# Patient Record
Sex: Female | Born: 1955 | Race: White | Hispanic: No | Marital: Married | Smoking: Former smoker
Health system: Southern US, Community
[De-identification: ages and names within clinical notes are randomized; demographics above are authoritative.]

## PROBLEM LIST (undated history)

## (undated) DIAGNOSIS — I10 Essential (primary) hypertension: Secondary | ICD-10-CM

## (undated) DIAGNOSIS — K589 Irritable bowel syndrome without diarrhea: Secondary | ICD-10-CM

## (undated) DIAGNOSIS — M1990 Unspecified osteoarthritis, unspecified site: Secondary | ICD-10-CM

## (undated) DIAGNOSIS — T7840XA Allergy, unspecified, initial encounter: Secondary | ICD-10-CM

## (undated) DIAGNOSIS — J309 Allergic rhinitis, unspecified: Secondary | ICD-10-CM

## (undated) DIAGNOSIS — G43909 Migraine, unspecified, not intractable, without status migrainosus: Secondary | ICD-10-CM

## (undated) DIAGNOSIS — M8580 Other specified disorders of bone density and structure, unspecified site: Secondary | ICD-10-CM

## (undated) HISTORY — DX: Unspecified osteoarthritis, unspecified site: M19.90

## (undated) HISTORY — DX: Other specified disorders of bone density and structure, unspecified site: M85.80

## (undated) HISTORY — DX: Allergy, unspecified, initial encounter: T78.40XA

## (undated) HISTORY — DX: Allergic rhinitis, unspecified: J30.9

## (undated) HISTORY — DX: Essential (primary) hypertension: I10

## (undated) HISTORY — DX: Irritable bowel syndrome, unspecified: K58.9

## (undated) HISTORY — PX: COLONOSCOPY: SHX174

## (undated) HISTORY — PX: BREAST BIOPSY: SHX20

## (undated) HISTORY — DX: Migraine, unspecified, not intractable, without status migrainosus: G43.909

---
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
## 1983-04-25 HISTORY — PX: CHOLECYSTECTOMY: SHX55

## 1988-01-25 HISTORY — PX: TUBAL LIGATION: SHX77

## 1999-03-24 ENCOUNTER — Other Ambulatory Visit: Admission: RE | Admit: 1999-03-24 | Discharge: 1999-03-24 | Payer: Self-pay | Admitting: Obstetrics and Gynecology

## 2000-03-24 ENCOUNTER — Other Ambulatory Visit: Admission: RE | Admit: 2000-03-24 | Discharge: 2000-03-24 | Payer: Self-pay | Admitting: Obstetrics and Gynecology

## 2001-04-17 ENCOUNTER — Other Ambulatory Visit: Admission: RE | Admit: 2001-04-17 | Discharge: 2001-04-17 | Payer: Self-pay | Admitting: Obstetrics and Gynecology

## 2002-03-12 ENCOUNTER — Encounter: Payer: Self-pay | Admitting: Internal Medicine

## 2003-10-25 HISTORY — PX: DILATION AND CURETTAGE OF UTERUS: SHX78

## 2003-10-28 ENCOUNTER — Encounter (INDEPENDENT_AMBULATORY_CARE_PROVIDER_SITE_OTHER): Payer: Self-pay | Admitting: *Deleted

## 2003-10-28 ENCOUNTER — Ambulatory Visit (HOSPITAL_COMMUNITY): Admission: RE | Admit: 2003-10-28 | Discharge: 2003-10-28 | Payer: Self-pay | Admitting: Obstetrics and Gynecology

## 2004-03-11 ENCOUNTER — Ambulatory Visit: Payer: Self-pay | Admitting: Internal Medicine

## 2004-11-16 ENCOUNTER — Ambulatory Visit: Payer: Self-pay | Admitting: Internal Medicine

## 2005-08-05 ENCOUNTER — Ambulatory Visit: Payer: Self-pay | Admitting: Family Medicine

## 2006-08-16 ENCOUNTER — Ambulatory Visit: Payer: Self-pay | Admitting: Internal Medicine

## 2007-05-23 ENCOUNTER — Encounter: Payer: Self-pay | Admitting: Internal Medicine

## 2007-05-28 ENCOUNTER — Encounter (INDEPENDENT_AMBULATORY_CARE_PROVIDER_SITE_OTHER): Payer: Self-pay | Admitting: *Deleted

## 2007-05-28 LAB — HM MAMMOGRAPHY: HM Mammogram: NORMAL

## 2007-06-14 DIAGNOSIS — D239 Other benign neoplasm of skin, unspecified: Secondary | ICD-10-CM

## 2007-06-14 HISTORY — DX: Other benign neoplasm of skin, unspecified: D23.9

## 2007-10-22 ENCOUNTER — Ambulatory Visit: Payer: Self-pay | Admitting: Internal Medicine

## 2007-10-22 DIAGNOSIS — J301 Allergic rhinitis due to pollen: Secondary | ICD-10-CM

## 2007-10-22 DIAGNOSIS — G43009 Migraine without aura, not intractable, without status migrainosus: Secondary | ICD-10-CM | POA: Insufficient documentation

## 2007-10-22 DIAGNOSIS — IMO0002 Reserved for concepts with insufficient information to code with codable children: Secondary | ICD-10-CM | POA: Insufficient documentation

## 2007-11-06 ENCOUNTER — Ambulatory Visit: Payer: Self-pay | Admitting: Family Medicine

## 2008-01-22 ENCOUNTER — Ambulatory Visit: Payer: Self-pay | Admitting: Gastroenterology

## 2008-02-15 ENCOUNTER — Ambulatory Visit: Payer: Self-pay | Admitting: Gastroenterology

## 2008-02-15 ENCOUNTER — Encounter: Payer: Self-pay | Admitting: Gastroenterology

## 2008-02-15 LAB — HM COLONOSCOPY

## 2008-02-19 ENCOUNTER — Encounter: Payer: Self-pay | Admitting: Gastroenterology

## 2008-05-05 ENCOUNTER — Ambulatory Visit: Payer: Self-pay | Admitting: Family Medicine

## 2008-05-05 DIAGNOSIS — G5600 Carpal tunnel syndrome, unspecified upper limb: Secondary | ICD-10-CM | POA: Insufficient documentation

## 2008-05-07 ENCOUNTER — Encounter: Payer: Self-pay | Admitting: Internal Medicine

## 2008-05-20 ENCOUNTER — Encounter: Payer: Self-pay | Admitting: Family Medicine

## 2008-05-23 ENCOUNTER — Encounter: Payer: Self-pay | Admitting: Internal Medicine

## 2008-05-27 ENCOUNTER — Encounter: Payer: Self-pay | Admitting: Internal Medicine

## 2008-05-28 DIAGNOSIS — R928 Other abnormal and inconclusive findings on diagnostic imaging of breast: Secondary | ICD-10-CM | POA: Insufficient documentation

## 2008-05-29 ENCOUNTER — Ambulatory Visit (HOSPITAL_BASED_OUTPATIENT_CLINIC_OR_DEPARTMENT_OTHER): Admission: RE | Admit: 2008-05-29 | Discharge: 2008-05-29 | Payer: Self-pay | Admitting: Orthopedic Surgery

## 2008-06-05 ENCOUNTER — Encounter: Payer: Self-pay | Admitting: Family Medicine

## 2008-06-12 ENCOUNTER — Encounter: Payer: Self-pay | Admitting: Family Medicine

## 2008-07-03 ENCOUNTER — Encounter: Payer: Self-pay | Admitting: Family Medicine

## 2008-08-15 ENCOUNTER — Encounter: Payer: Self-pay | Admitting: Family Medicine

## 2008-09-22 ENCOUNTER — Telehealth: Payer: Self-pay | Admitting: Internal Medicine

## 2008-09-24 ENCOUNTER — Telehealth: Payer: Self-pay | Admitting: Internal Medicine

## 2008-10-20 ENCOUNTER — Ambulatory Visit: Payer: Self-pay | Admitting: Family Medicine

## 2008-11-27 ENCOUNTER — Encounter: Payer: Self-pay | Admitting: Internal Medicine

## 2008-12-04 ENCOUNTER — Encounter (INDEPENDENT_AMBULATORY_CARE_PROVIDER_SITE_OTHER): Payer: Self-pay | Admitting: *Deleted

## 2009-04-14 ENCOUNTER — Ambulatory Visit: Payer: Self-pay | Admitting: Nurse Practitioner

## 2009-05-01 ENCOUNTER — Ambulatory Visit (HOSPITAL_COMMUNITY): Admission: RE | Admit: 2009-05-01 | Discharge: 2009-05-01 | Payer: Self-pay | Admitting: Family Medicine

## 2009-05-19 ENCOUNTER — Ambulatory Visit: Payer: Self-pay | Admitting: Nurse Practitioner

## 2009-05-28 ENCOUNTER — Encounter: Payer: Self-pay | Admitting: Internal Medicine

## 2009-06-01 ENCOUNTER — Encounter: Payer: Self-pay | Admitting: Internal Medicine

## 2009-10-05 ENCOUNTER — Ambulatory Visit: Payer: Self-pay | Admitting: Internal Medicine

## 2009-10-21 ENCOUNTER — Encounter (INDEPENDENT_AMBULATORY_CARE_PROVIDER_SITE_OTHER): Payer: Self-pay | Admitting: *Deleted

## 2009-12-31 ENCOUNTER — Ambulatory Visit: Payer: Self-pay | Admitting: Internal Medicine

## 2009-12-31 DIAGNOSIS — J019 Acute sinusitis, unspecified: Secondary | ICD-10-CM

## 2010-01-27 ENCOUNTER — Ambulatory Visit
Admission: RE | Admit: 2010-01-27 | Discharge: 2010-01-27 | Payer: Self-pay | Source: Home / Self Care | Attending: Family Medicine | Admitting: Family Medicine

## 2010-02-09 ENCOUNTER — Ambulatory Visit
Admission: RE | Admit: 2010-02-09 | Discharge: 2010-02-09 | Payer: Self-pay | Source: Home / Self Care | Attending: Internal Medicine | Admitting: Internal Medicine

## 2010-02-23 NOTE — Miscellaneous (Signed)
Summary: flu vaccine at [facility]   Clinical Lists Changes  Observations: Added new observation of FLU VAX: Historical (10/20/2009 9:13)      Immunization History:  Influenza Immunization History:    Influenza:  historical (10/20/2009) Pt received flu vaccine at [facility] [address] , [place].       Lowella Petties CMA  October 21, 2009 9:13 AM

## 2010-02-23 NOTE — Letter (Signed)
Summary: Results Follow up Letter  [place] at [facility]  [address] [place], [place] [address]   Phone: [phone]  Fax: [phone]    06/01/2009 MRN: [id number]  [facility] [address] [place], [place]  [address]  Dear Ms. Deiss,  The following are the results of your recent test(s):  Test         Result    Pap Smear:        Normal _____  Not Normal _____ Comments: ______________________________________________________ Cholesterol: LDL(Bad cholesterol):         Your goal is less than:         HDL (Good cholesterol):       Your goal is more than: Comments:  ______________________________________________________ Mammogram:        Normal __X___  Not Normal _____ Comments:Mammo and ultrasound of breasts was fine. Repeat recommended in 1 year  ___________________________________________________________________ Hemoccult:        Normal _____  Not normal _______ Comments:    _____________________________________________________________________ Other Tests:    We routinely do not discuss normal results over the telephone.  If you desire a copy of the results, or you have any questions about this information we can discuss them at your next office visit.   Sincerely,      Tillman Abide, MD

## 2010-02-23 NOTE — Assessment & Plan Note (Signed)
Summary: 2:00 ?SINUS INFECTION/CLE   Vital Signs:  Patient profile:   55 year old female Weight:      148 pounds BMI:     25.50 Temp:     98.4 degrees F oral Resp:     14 per minute BP sitting:   110 / 78  (left arm) Cuff size:   regular  Vitals Entered By: Mervin Hack CMA Duncan Dull) (October 05, 2009 2:07 PM) CC: sinus infection   History of Present Illness: Started wtih sinus congestion and head symptoms Started about 3 weeks ago and now just went into her chest about 2 weeks ago cough with colored sputum now  No fever No SOB Initially had sore throat--resolved after a couple of days No ear pain  Tried claritin, tylenol and then dayquil and nyquil (helped her sleep)  Allergies: No Known Drug Allergies  Past History:  Past medical, surgical, family and social histories (including risk factors) reviewed for relevance to current acute and chronic problems.  Past Medical History: Reviewed history from 10/22/2007 and no changes required. Migraines IBS Osteoarthritis Allergic rhinitis  Past Surgical History: Reviewed history from 10/22/2007 and no changes required. 12/76 Tonsillectomy 4/85 Cholecystectomy VD x 2 1990 Tubal ligation 10/05 Conization, D&C  Family History: Reviewed history from 10/22/2007 and no changes required. Dad with HTN, DM Mom has macular degeneration Macular degeneration strong in family DM also in pat GF 1 brother, 1 sister with HTN and cerebral aneurysm Mat aunt with breast cancer  Social History: Reviewed history from 11/06/2007 and no changes required. 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  NAD Head:  No maxillary or frontal tenderness Ears:  R ear normal and L ear normal.   Nose:  moderate  congestion and inflammation Mouth:  no erythema and no exudates.   Neck:  supple, no masses, and no cervical lymphadenopathy.   Lungs:  normal respiratory effort, no intercostal retractions, no accessory muscle use, and normal breath sounds.     Impression & Recommendations:  Problem # 1:  SINUSITIS, ACUTE (ICD-461.9) Assessment New has been going on for 3 weeks will recommend analgesics amoxil for 10 days  Complete Medication List: 1)  Flonase 50 Mcg/act Susp (Fluticasone propionate) .... 2 sprays each nostril daily for allergies as needed 2)  Imitrex 50 Mg Tabs (Sumatriptan succinate) .Marland Kitchen.. 1 tab at onset, may repeat in 2 hrs prn 3)  Effexor Xr 75 Mg Xr24h-cap (Venlafaxine hcl) .... Take 1 by mouth once daily 4)  Amoxicillin 500 Mg Tabs (Amoxicillin) .... 2 tabs by mouth two times a day for respiratory infection  Patient Instructions: 1)  Please schedule a follow-up appointment in 1 year.  Prescriptions: AMOXICILLIN 500 MG TABS (AMOXICILLIN) 2 tabs by mouth two times a day for respiratory infection  #40 x 0   Entered and Authorized by:   Cindee Salt MD   Signed by:   Cindee Salt MD on 10/05/2009   Method used:   Electronically to        [facility]. [phone]* (retail)       [address]       [place], [place]  [address]  Ph: [phone]       Fax: [phone]   RxID:   [id number]   Current Allergies (reviewed today): No known allergies

## 2010-02-23 NOTE — Assessment & Plan Note (Signed)
Summary: ?SINUS INFECTION,? LOW IRON/CLE   Vital Signs:  Patient profile:   55 year old female Weight:      148 pounds Temp:     98.9 degrees F oral Resp:     14 per minute BP sitting:   100 / 78  (left arm) Cuff size:   regular  Vitals Entered By: Mervin Hack CMA Duncan Dull) (December 31, 2009 5:18 PM) CC: sinus infection?   History of Present Illness: "worn out" Having bad head congestion and constant cough Cough is esp at night----green mucus esp in AM Going on intermittently for 3-4 weeks  No fever but feels cold No sore throat or ear pain Eyes bother her--"feel weird"    Slightly blood shot  No SOB  Tried claritin, sudafed, dayquil--no apparent help cough keeping her up  Allergies: No Known Drug Allergies  Past History:  Past medical, surgical, family and social histories (including risk factors) reviewed for relevance to current acute and chronic problems.  Past Medical History: Reviewed history from 10/22/2007 and no changes required. Migraines IBS Osteoarthritis Allergic rhinitis  Past Surgical History: Reviewed history from 10/22/2007 and no changes required. 12/76 Tonsillectomy 4/85 Cholecystectomy VD x 2 1990 Tubal ligation 10/05 Conization, D&C  Family History: Reviewed history from 10/22/2007 and no changes required. Dad with HTN, DM Mom has macular degeneration Macular degeneration strong in family DM also in pat GF 1 brother, 1 sister with HTN and cerebral aneurysm Mat aunt with breast cancer  Social History: Reviewed history from 10/05/2009 and no changes required. 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  NAD Head:  no maxillary or frontal tenderness Ears:  R ear normal and L ear  normal.   Nose:  moderate congestion with opaque mucus on right Mouth:  no erythema and no exudates.   Neck:  supple, no masses, no thyromegaly, and no cervical lymphadenopathy.   Lungs:  normal respiratory effort, no intercostal retractions, no accessory muscle use, normal breath sounds, no crackles, and no wheezes.     Impression & Recommendations:  Problem # 1:  SINUSITIS - ACUTE-NOS (ICD-461.9) Assessment New going on over 3 weeks appropriate to try antibiotics analgesics tramadol for cough  Complete Medication List: 1)  Flonase 50 Mcg/act Susp (Fluticasone propionate) .... 2 sprays each nostril daily for allergies as needed 2)  Imitrex 50 Mg Tabs (Sumatriptan succinate) .Marland Kitchen.. 1 tab at onset, may repeat in 2 hrs prn 3)  Effexor Xr 75 Mg Xr24h-cap (Venlafaxine hcl) .... Take 1 by mouth once daily 4)  Amoxicillin 500 Mg Tabs (Amoxicillin) .... 2 tabs by mouth two times a day for respiratory infection 5)  Tramadol Hcl 50 Mg Tabs (Tramadol hcl) .Marland Kitchen.. 1-2 at bedtime as needed for severe cough  Patient Instructions: 1)  Please set up appt for physical at your convenience Prescriptions: TRAMADOL HCL 50 MG TABS (TRAMADOL HCL) 1-2 at bedtime as needed for severe cough  #30 x 0   Entered and Authorized by:   Cindee Salt MD   Signed by:   Cindee Salt MD on 12/31/2009   Method used:   Electronically to        [facility]. [phone]* (retail)  [address]       [place], [place]  [address]       Ph: [phone]       Fax: [phone]   RxID:   [id number] AMOXICILLIN 500 MG TABS (AMOXICILLIN) 2 tabs by mouth two times a day for respiratory infection  #40 x 0   Entered and Authorized by:   Cindee Salt MD   Signed by:   Cindee Salt MD on 12/31/2009   Method used:   Electronically to        [facility]. [phone]* (retail)       [address]       [place], [place]  [address]       Ph: [phone]       Fax:  [phone]   RxID:   [id number]    Orders Added: 1)  Est. Patient Level III [[address]]    Current Allergies (reviewed today): No known allergies

## 2010-02-25 NOTE — Assessment & Plan Note (Signed)
Summary: ?sinus infection/alc   Vital Signs:  Patient profile:   55 year old female Weight:      152.25 pounds Temp:     98.3 degrees F oral Pulse rate:   84 / minute Pulse rhythm:   regular BP sitting:   100 / 60  (left arm) Cuff size:   regular  Vitals Entered By: Sydell Axon LPN (January 27, 2010 2:24 PM) CC: Head congestion, runny nose, productive cough/green   History of Present Illness: Pt here again from one month ago sxs similar. She has no fever or chills, no ear pain but hads popping, no headache. She has rhinitis started today which has been clear, no ST, cough  that is productive of green all day long since Sun. She has no N/V. She has taken Amox from daughter in law.   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 2 Sprays Each Nostril Daily For Allergies As Needed 2)  Imitrex 50 Mg Tabs (Sumatriptan Succinate) .Marland Kitchen.. 1 Tab At Onset, May Repeat in 2 Hrs Prn 3)  Effexor Xr 75 Mg Xr24h-Cap (Venlafaxine Hcl) .... Take 1 By Mouth Once Daily 4)  Amoxicillin 500 Mg Tabs (Amoxicillin) .... 2 Tabs By Mouth Two Times A Day For Respiratory Infection 5)  Tramadol Hcl 50 Mg Tabs (Tramadol Hcl) .Marland Kitchen.. 1-2 At Bedtime As Needed For Severe Cough  Current Medications (verified): 1)  Flonase 50 Mcg/act  Susp (Fluticasone Propionate) .... 2 Sprays Each Nostril Daily For Allergies As Needed 2)  Imitrex 50 Mg Tabs (Sumatriptan Succinate) .Marland Kitchen.. 1 Tab At Onset, May Repeat in 2 Hrs Prn 3)  Effexor Xr 75 Mg Xr24h-Cap (Venlafaxine Hcl) .... Take 1 By Mouth Once Daily 4)  Tramadol Hcl 50 Mg Tabs (Tramadol Hcl) .Marland Kitchen.. 1-2 At Bedtime As Needed  For Severe Cough  Allergies: No Known Drug Allergies  Physical Exam  General:  Well-developed,well-nourished,in no acute distress; alert,appropriate and cooperative throughout examination, mildly congested. Head:  no maxillary or frontal tenderness Eyes:  Conjunctiva clear bilaterally.  Ears:  R ear normal and L ear normal.  Good nmobility. Nose:  Minimsal inflammation, patent nares, scant clear discharge  Mouth:  no erythema and no exudates.   Neck:  supple, no masses, no thyromegaly, and no cervical lymphadenopathy.   Lungs:  normal respiratory effort, no intercostal retractions, no accessory muscle use, normal breath sounds, no crackles, and no wheezes.  Signif wet hoarse cough. Heart:  Normal rate and regular rhythm. S1 and S2 normal without gallop, murmur, click, rub or other extra sounds.   Impression & Recommendations:  Problem # 1:  URI (ICD-465.9) Assessment New  See instructions. Slowly titrate guaif up as she thinks she is allergic to it...start with 1/2 tsp Robitussin plain once  today, twice tomm etc.  Instructed on symptomatic treatment. Call if symptoms persist or worsen.   Complete Medication List: 1)  Flonase 50 Mcg/act Susp (Fluticasone propionate) .... 2 sprays each nostril daily for allergies as needed 2)  Imitrex 50 Mg Tabs (Sumatriptan succinate) .Marland Kitchen.. 1 tab at onset, may repeat in 2 hrs prn 3)  Effexor Xr 75 Mg Xr24h-cap (Venlafaxine hcl) .... Take  1 by mouth once daily 4)  Tramadol Hcl 50 Mg Tabs (Tramadol hcl) .Marland Kitchen.. 1-2 at bedtime as needed for severe cough 5)  Zithromax Z-pak 250 Mg Tabs (Azithromycin) .... As dir.  Patient Instructions: 1)  Take Guaifenesin by [facility] to [facility] and getting MUCOUS RELIEF EXPECTORANT (400mg ), take 11/2 tabs by mouth AM and NOON. 2)  Drink lots of fluids anytime taking Guaifenesin.  3)  Tyl as able. 4)  Vicks at night. 5)  Zithromax if worsens. Prescriptions: ZITHROMAX Z-PAK 250 MG TABS  (AZITHROMYCIN) as dir.  #1 pak x 0   Entered and Authorized by:   Shaune Leeks MD   Signed by:   Shaune Leeks MD on 01/27/2010   Method used:   Electronically to        [facility]. [phone]* (retail)       [address]       [place], [place]  [address]       Ph: [phone]       Fax: [phone]   RxID:   [id number]    Orders Added: 1)  Est. Patient Level III [[address]]    Current Allergies (reviewed today): No known allergies

## 2010-02-25 NOTE — Assessment & Plan Note (Signed)
Summary: TDAP/alc   Nurse Visit   Allergies: No Known Drug Allergies  Immunizations Administered:  Tetanus Vaccine:    Vaccine Type: Tdap    Site: left deltoid    Mfr: GlaxoSmithKline    Dose: 0.5 ml    Route: IM    Given by: Mervin Hack CMA (AAMA)    Exp. Date: 11/13/2011    Lot #: [id number]    VIS given: 12/12/07 version given February 09, 2010.  Orders Added: 1)  Tdap => 21yrs IM [[address]] 2)  Admin 1st Vaccine [[address]]

## 2010-03-25 HISTORY — PX: CARPAL TUNNEL RELEASE: SHX101

## 2010-04-01 ENCOUNTER — Encounter: Payer: Self-pay | Admitting: Family Medicine

## 2010-04-05 ENCOUNTER — Ambulatory Visit (HOSPITAL_BASED_OUTPATIENT_CLINIC_OR_DEPARTMENT_OTHER)
Admission: RE | Admit: 2010-04-05 | Discharge: 2010-04-05 | Disposition: A | Discharge status: Home / Self Care | Payer: BC Managed Care – PPO | Source: Ambulatory Visit | Attending: Orthopedic Surgery | Admitting: Orthopedic Surgery

## 2010-04-05 DIAGNOSIS — G5600 Carpal tunnel syndrome, unspecified upper limb: Secondary | ICD-10-CM | POA: Insufficient documentation

## 2010-04-05 DIAGNOSIS — G43909 Migraine, unspecified, not intractable, without status migrainosus: Secondary | ICD-10-CM | POA: Insufficient documentation

## 2010-04-07 ENCOUNTER — Encounter: Payer: Self-pay | Admitting: Internal Medicine

## 2010-04-22 NOTE — Letter (Signed)
Summary: [facility]   Imported By: Kassie Mends 04/12/2010 09:59:02  _____________________________________________________________________  External Attachment:    Type:   Image     Comment:   External Document

## 2010-05-10 ENCOUNTER — Ambulatory Visit (INDEPENDENT_AMBULATORY_CARE_PROVIDER_SITE_OTHER): Payer: BC Managed Care – PPO | Admitting: Internal Medicine

## 2010-05-10 ENCOUNTER — Encounter: Payer: Self-pay | Admitting: Internal Medicine

## 2010-05-10 VITALS — BP 118/80 | HR 80 | Temp 98.4°F | Ht 64.0 in | Wt 142.0 lb

## 2010-05-10 DIAGNOSIS — R42 Dizziness and giddiness: Secondary | ICD-10-CM

## 2010-05-10 NOTE — Progress Notes (Signed)
Subjective:    Patient ID: [name], female    DOB: January 23, 1956, 55 y.o.   MRN: [id number]  HPI Concerned aboyut ?inner ear infection Dizzy when she turns her head since yesterday Feels like her eyes are jumping   Sharp pain in left ear 3 nights ago--resolved quickly No fever but hot and cold yesterday and clammy feeling No recent sinus problems SOme allergy symptoms but using nasacort regularly  Has feeling in her stomach No vomiting and appetite is off  No true vertigo  Current outpatient prescriptions:estradiol (ESTRACE) 0.5 MG tablet, Take 0.5 mg by mouth daily.  , Disp: , Rfl: ;  SUMAtriptan (IMITREX) 50 MG tablet, Take 50 mg by mouth. 1 tab at onset, may repeat in 2 hrs prn , Disp: , Rfl: ;  venlafaxine (EFFEXOR) 75 MG tablet, Take 75 mg by mouth. 2 sprays each nostril daily for allergies as needed , Disp: , Rfl:  DISCONTD: estradiol (ESTRACE) 0.1 MG/GM vaginal cream, Place 2 g vaginally daily.  , Disp: , Rfl: ;  DISCONTD: azithromycin (ZITHROMAX) 250 MG tablet, Take 2 tablets by mouth on day 1, followed by 1 tablet by mouth daily for 4 days. As directed , Disp: , Rfl: ;  DISCONTD: TraMADol HCl 50 MG TBSO, Take by mouth. 1-2 at bedtime as needed for severe cough , Disp: , Rfl:   Past Medical History  Diagnosis Date  . Migraines   . IBS (irritable bowel syndrome)   . Osteoarthritis   . AR (allergic rhinitis)     Past Surgical History  Procedure Date  . Tonsillectomy 12/76  . Cholecystectomy 4/85  . Vaginal delivery     x 2  . Tubal ligation 1990  . Dilation and curettage of uterus 10/05    Family History  Problem Relation Age of Onset  . Macular degeneration Mother   . Hypertension Father   . Diabetes Father   . Hypertension Sister   . Cerebral aneurysm Sister   . Cancer Maternal Aunt     breast   . Diabetes Paternal Grandfather     History   Social History  . Marital Status: Married    Spouse Name: N/A    Number of Children: 2  . Years of  Education: N/A   Occupational History  . 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 Smoking status: Former Smoker -- 12 years  . Smokeless tobacco: Not on file  . Alcohol Use: Yes     Rare  . Drug Use: Not on file  . Sexually Active: Not on file   Other Topics Concern  . Not on file   Social History Narrative  . No narrative on file   Review of Systems Bad migraine yesterday morning--better with imitrex Inner ear problems back in 8th grade No diplopia or vision loss No speech or swallowing problems No weakness or sensory problems    Objective:   Physical Exam  Constitutional: She appears well-developed and well-nourished. No distress.  HENT:  Right Ear: External ear normal.  Left Ear: External ear normal.       TMs appear normal--no effusion  Eyes: Conjunctivae and EOM are normal. Pupils are equal, round, and reactive to light.       ?slight horizontal nystagmus with end right lateral gaze  Neck: Normal range of motion. Neck supple.  Lymphadenopathy:    She has no cervical adenopathy.  Neurological: She is alert. She has normal  strength. She displays no tremor. No cranial nerve deficit. She exhibits normal muscle tone. She displays a negative Romberg sign. Gait normal.          Assessment & Plan:

## 2010-05-10 NOTE — Patient Instructions (Addendum)
Please try over the counter meclizine, up to 25mg  three times daily if the dizzy feelings recur and are more severe

## 2010-05-26 ENCOUNTER — Ambulatory Visit (INDEPENDENT_AMBULATORY_CARE_PROVIDER_SITE_OTHER): Payer: BC Managed Care – PPO | Admitting: Family Medicine

## 2010-05-26 ENCOUNTER — Encounter: Payer: Self-pay | Admitting: Family Medicine

## 2010-05-26 VITALS — BP 120/70 | HR 72 | Temp 98.7°F | Ht 63.5 in | Wt 145.8 lb

## 2010-05-26 DIAGNOSIS — L0390 Cellulitis, unspecified: Secondary | ICD-10-CM

## 2010-05-26 DIAGNOSIS — L0291 Cutaneous abscess, unspecified: Secondary | ICD-10-CM

## 2010-05-26 MED ORDER — CEPHALEXIN 500 MG PO CAPS: 500.0000 mg | ORAL_CAPSULE | Freq: Three times a day (TID) | ORAL | Status: AC

## 2010-05-26 NOTE — Progress Notes (Signed)
55 yo with spider bite vs other insect bite several days ago, now with some mild surrounding redness, no associated induration or fluctuance. No pus. Small central scab.  The PMH, PSH, Social History, Family History, Medications, and allergies have been reviewed in [facility], and have been updated if relevant.  ROS above  GEN: WDWN, NAD, Non-toxic, A & O x 3 HEENT: Atraumatic, Normocephalic. Neck supple. No masses, No LAD. Ears and Nose: No external deformity. SKIN: L anterior shoulder, redness mild warmth. Central scab without fluctuance. No induration. EXTR: No c/c/e NEURO Normal gait.  PSYCH: Normally interactive. Conversant. Not depressed or anxious appearing.  Calm demeanor.   A/P: cellulitis, Keflex 500 mg po TID. Warm compresses. F/u if not improving.

## 2010-05-27 ENCOUNTER — Ambulatory Visit (INDEPENDENT_AMBULATORY_CARE_PROVIDER_SITE_OTHER): Payer: BC Managed Care – PPO | Admitting: Internal Medicine

## 2010-05-27 ENCOUNTER — Encounter: Payer: Self-pay | Admitting: Internal Medicine

## 2010-05-27 VITALS — BP 110/70 | HR 88 | Temp 98.3°F | Ht 64.0 in | Wt 145.0 lb

## 2010-05-27 DIAGNOSIS — Z0000 Encounter for general adult medical examination without abnormal findings: Secondary | ICD-10-CM | POA: Insufficient documentation

## 2010-05-27 DIAGNOSIS — G43009 Migraine without aura, not intractable, without status migrainosus: Secondary | ICD-10-CM

## 2010-05-27 DIAGNOSIS — N951 Menopausal and female climacteric states: Secondary | ICD-10-CM | POA: Insufficient documentation

## 2010-05-27 DIAGNOSIS — Z13220 Encounter for screening for lipoid disorders: Secondary | ICD-10-CM

## 2010-05-27 DIAGNOSIS — J309 Allergic rhinitis, unspecified: Secondary | ICD-10-CM

## 2010-05-27 MED ORDER — FLUTICASONE PROPIONATE 50 MCG/ACT NA SUSP: 2.0000 | Freq: Every day | NASAL | Status: DC

## 2010-05-27 MED ORDER — VITAMIN D (ERGOCALCIFEROL) 1.25 MG (50000 UNIT) PO CAPS: 50000.0000 [IU] | ORAL_CAPSULE | ORAL | Status: DC

## 2010-05-27 NOTE — Progress Notes (Signed)
Subjective:    Patient ID: [name], female    DOB: [birth year], 55 y.o.   MRN: [id number]  HPI Insect bite is better Left shoulder lesion is improved  Dizziness is better Just resolved  Sees Dr Courtney Heys for gyn Pap due next week mammo done last year---he orders them  Derm exam next week by Dr Gwen Pounds Had dysplastic nevus in past  Wants cholesterol checked  Still on venlafaxine Works for hot flashes along with estrogen  Uses imitrex prn Usually if she is not careful and misses meals, or with weather barometric changes  Current outpatient prescriptions:cephALEXin (KEFLEX) 500 MG capsule, Take 1 capsule (500 mg total) by mouth 3 (three) times daily., Disp: 30 capsule, Rfl: 0;  estradiol (ESTRACE) 0.5 MG tablet, Take 0.5 mg by mouth daily.  , Disp: , Rfl: ;  fluticasone (FLONASE) 50 MCG/ACT nasal spray, 2 sprays by Nasal route daily., Disp: 16 g, Rfl: 5;  SUMAtriptan (IMITREX) 50 MG tablet, Take 50 mg by mouth. 1 tab at onset, may repeat in 2 hrs prn , Disp: , Rfl:  venlafaxine (EFFEXOR) 75 MG tablet, Take 75 mg by mouth. 2 sprays each nostril daily for allergies as needed , Disp: , Rfl: ;  DISCONTD: fluticasone (FLONASE) 50 MCG/ACT nasal spray, 2 sprays by Nasal route daily.  , Disp: , Rfl:   Past Medical History  Diagnosis Date  . Migraines   . IBS (irritable bowel syndrome)   . Osteoarthritis   . AR (allergic rhinitis)     Past Surgical History  Procedure Date  . Tonsillectomy 12/76  . Cholecystectomy 4/85  . Vaginal delivery     x 2  . Tubal ligation 1990  . Dilation and curettage of uterus 10/05  . Carpal tunnel release 3/12    right. Left done 2010. Dr Merlyn Lot    Family History  Problem Relation Age of Onset  . Macular degeneration Mother   . Hypertension Father   . Diabetes Father   . Hypertension Sister   . Cerebral aneurysm Sister   . Cancer Maternal Aunt     breast   . Diabetes Paternal Grandfather     History   Social History  . Marital  Status: Married    Spouse Name: N/A    Number of Children: 2  . 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 Smoking status: Former Smoker -- 12 years  . Smokeless tobacco: Not on file  . Alcohol Use: Yes     Rare  . Drug Use: Not on file  . Sexually Active: Not on file   Other Topics Concern  . Not on file   Social History Narrative  . No narrative on file   Review of Systems  Constitutional: Negative for fatigue and unexpected weight change.       Exercises regularly Uses seat belt  HENT: Positive for sneezing, sinus pressure and tinnitus. Negative for hearing loss and dental problem.        Rare noise in ears Regular with dentist Uses flonase seasonally. Claritin prn  Eyes: Negative for visual disturbance.       No sig vision loss or diplopia  Respiratory: Negative for cough, chest tightness and shortness of breath.   Cardiovascular: Positive for leg swelling. Negative for chest pain and palpitations.       Occ edema if has salt  Gastrointestinal: Negative for  nausea, vomiting and blood in stool.       IBS better of late No heartburn  Genitourinary: Positive for frequency. Negative for dysuria, urgency, difficulty urinating and dyspareunia.  Musculoskeletal: Negative for back pain, joint swelling and arthralgias.  Neurological: Positive for headaches. Negative for dizziness, weakness, light-headedness and numbness.  Hematological: Negative for adenopathy. Does not bruise/bleed easily.  Psychiatric/Behavioral: Positive for sleep disturbance. Negative for dysphoric mood. The patient is not nervous/anxious.        Chronic sleep problems--awakens alert at 2AM Benedryl helps at times No serious daytime somnolence       Objective:   Physical Exam  Constitutional: She appears well-developed and well-nourished. No distress.  HENT:  Head: Normocephalic and atraumatic.  Right Ear: External ear normal.    Left Ear: External ear normal.  Mouth/Throat: Oropharynx is clear and moist. No oropharyngeal exudate.       TMs fine  Eyes: Conjunctivae and EOM are normal. Pupils are equal, round, and reactive to light.       Fundi benign  Neck: Normal range of motion. Neck supple. No thyromegaly present.  Cardiovascular: Normal rate, regular rhythm, normal heart sounds and intact distal pulses.  Exam reveals no gallop.   No murmur heard. Pulmonary/Chest: Effort normal and breath sounds normal. No respiratory distress. She has no wheezes. She has no rales.  Abdominal: Soft. She exhibits no mass. There is no tenderness.  Musculoskeletal: Normal range of motion. She exhibits no edema and no tenderness.  Lymphadenopathy:    She has no cervical adenopathy.  Neurological: She is alert. She exhibits normal muscle tone.       Normal strength and gait  Skin: Skin is warm. No rash noted.       Redness now lacy around bug bite and not in axilla anymore  Psychiatric: She has a normal mood and affect. Her behavior is normal. Judgment and thought content normal.          Assessment & Plan:

## 2010-05-27 NOTE — Patient Instructions (Signed)
Please try taking the estrogen only 6 days a week. If no problems with this after a month or so, try reducing it to 5 days per week. Continue to reduce the dose to find the lowest effective dose

## 2010-05-28 LAB — LDL CHOLESTEROL, DIRECT: Direct LDL: 74.5 mg/dL

## 2010-05-28 LAB — LIPID PANEL
HDL: 61.4 mg/dL (ref >39.00)
Total CHOL/HDL Ratio: 3
Triglycerides: 239 mg/dL — ABNORMAL HIGH (ref 0.0–149.0)

## 2010-06-01 ENCOUNTER — Encounter: Payer: Self-pay | Admitting: Internal Medicine

## 2010-06-07 ENCOUNTER — Encounter: Payer: Self-pay | Admitting: *Deleted

## 2010-06-08 ENCOUNTER — Encounter (INDEPENDENT_AMBULATORY_CARE_PROVIDER_SITE_OTHER): Payer: BC Managed Care – PPO | Admitting: Nurse Practitioner

## 2010-06-08 DIAGNOSIS — G43109 Migraine with aura, not intractable, without status migrainosus: Secondary | ICD-10-CM

## 2010-06-08 NOTE — Op Note (Signed)
[name], [name]              ACCOUNT NO.:  [id number]   MEDICAL RECORD NO.:  [id number]          PATIENT TYPE:  AMB   LOCATION:  DSC                          FACILITY:  [facility]   PHYSICIAN:  Cindee Salt, M.D.       DATE OF BIRTH:  1955-05-30   DATE OF PROCEDURE:  05/29/2008  DATE OF DISCHARGE:                               OPERATIVE REPORT   PREOPERATIVE DIAGNOSIS:  Carpal tunnel syndrome left hand.   POSTOPERATIVE DIAGNOSIS:  Carpal tunnel syndrome left hand.   OPERATION:  Decompression left median nerve.   SURGEON:  Cindee Salt, MD   ASSISTANT:  Joaquin Courts, RN   ANESTHESIA:  Forearm based IV regional with supplementation.   ANESTHESIOLOGIST:  Zenon Mayo, MD   HISTORY:  The patient is a 55 year old female with a history of carpal  tunnel syndrome, EMG nerve conduction is positive which has not  responded to conservative treatment.  She has elected to undergo  surgical decompression.  Pre, peri and postoperative course have been  discussed along with risks and complications.  She is aware that there  is no guarantee with the surgery, possibility of infection, recurrence  injury to arteries, nerves, tendons, incomplete relief of symptoms and  dystrophy.  In the preoperative area, the patient is seen.  The  extremity marked by both the patient and surgeon.  Antibiotic given.   PROCEDURE:  The patient was brought to the operating room where a  forearm based IV regional anesthetic was carried out without difficulty  after a time-out was taken confirming the patient and procedure.  This  was done under the direction of Dr. Sampson Goon.  Prep was done with  ChloraPrep.  A 3-minute dry time was allowed.  The patient was then  draped.  The area was locally infiltrated with 0.25% Marcaine without  epinephrine.  After adequate anesthesia was afforded to the patient, a  longitudinal incision was made in the palm and carried down through the  subcutaneous tissue.  Bleeders  were electrocauterized with bipolar.  Dissection was carried down splitting the palmar fascia.  Superficial  palmar arch was identified.  The flexor tendon to the ring and little  finger identified.  Retractors were placed to the ulnar side of the  median nerve.  The carpal retinaculum was incised with sharp dissection.  Right angle and Sewall retractor were placed between the skin and  forearm fascia.  The fascia released for approximately a centimeter and  half proximal to the wrist crease under direct vision.  Canal was  explored.  Area of compression to the nerve was apparent.  No further  lesions were identified.  The wound was irrigated.  Skin was then closed  with  interrupted 5-0 Vicryl Rapide sutures.  A sterile compressive dressing  and splint to the wrist applied.  The patient tolerated the procedure  well and was taken to the recovery room for observation in satisfactory  condition.  She will be discharged home to return to the [facility]  [place] in 1 week on Vicodin.  ______________________________  Cindee Salt, M.D.     GK/MEDQ  D:  05/29/2008  T:  05/30/2008  Job:  [id number]

## 2010-06-08 NOTE — Assessment & Plan Note (Signed)
NAMEJURNIE, [name]              ACCOUNT NO.:  [id number]   MEDICAL RECORD NO.:  [id number]          PATIENT TYPE:  POB   LOCATION:  [facility] at [facility]         FACILITY:  [facility]   PHYSICIAN:  Allie Bossier, MD        DATE OF BIRTH:  04-Sep-1955   DATE OF SERVICE:  05/19/2009                                  CLINIC NOTE   HISTORY OF PRESENT ILLNESS:  The patient comes to the office today for  followup on her migraine headaches.  The patient was last seen on April 14, 2009.  Please see that note.  At that time, we did order an MRA.  The patient had that done and it did return with negative for any type  of aneurysm.  We also started the patient on Effexor 37.5 mg.  She did  not have any side effects from that and is willing to go up on that.  The patient feels that her headaches are better.  She has had taken  Imitrex once only.   ASSESSMENT:  Migraine.   PLAN:  We will increase for prevention, Effexor 75 mg XR one p.o. daily  #30 with 3 refills.  The patient will return to the office in 6 months  or sooner as needed.      Remonia Richter, NP    ______________________________  Allie Bossier, MD    LR/MEDQ  D:  05/19/2009  T:  05/20/2009  Job:  [id number]

## 2010-06-08 NOTE — Assessment & Plan Note (Signed)
[name], [name]              ACCOUNT NO.:  [id number]   MEDICAL RECORD NO.:  [id number]          PATIENT TYPE:  POB   LOCATION:  [facility] at [facility]         FACILITY:  [facility]   PHYSICIAN:  Tinnie Gens, MD        DATE OF BIRTH:  1955/05/19   DATE OF SERVICE:  04/14/2009                                  CLINIC NOTE   The patient comes to office today for new headache consultation.  The  patient has had been diagnosed with migraines since she was 55 years  old.  She does have aura with her headaches approximately 2-3 per year.  Her aura is visual.  At times, she has aura without headache.  Her  headaches are located in her left temple exclusively.  In a 1 month  period, she has 0 severe, she has 6-12 moderate to mild. She is  currently taking Imitrex 50 mg.  She rarely has to take 100 mg.  She  uses an estradiol patch.  She cycles progesterone every 2-3 months.  She  also takes Benadryl every night for sleep.  She has difficulty with  sleep.  She has difficulty falling asleep taking 1-4 hours falling  asleep.  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  She has difficulty maintaining sleep sometimes only sleeping for 2-4  hours and then she will be up by 6 a.m.  The Benadryl does seem to work  for her.  She has been on Ambien in the past, but did not want to become  habituated to Ambien.  For birth control, she has had a bilateral tubal  ligation.   OBSTETRICAL HISTORY:  G2 P2.  Last Pap smear was in April of 2010.  She  has had an abnormal in the past requiring her to have a conization  surgeries.  She has had her gallbladder out, bilateral tubal ligation,  and carpal tunnel.   FAMILY HISTORY:  She has a father and a grandfather with diabetes.  Grandmother with heart attack.  Grandmother with heart disease.  Father  with high blood pressure.  Of significant issue for her is a sister and  a brother that have cerebral aneurysm, in fact her sister died in September 28, 2022  of a cerebral aneurysm.   PERSONAL AND MEDICAL HISTORY:  The patient has anxiety, IBS, migraine  headaches.  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  She does not smoke.  She drinks alcohol 3 times per week.  She has 2 caffeinated beverages per day.   REVIEW OF SYSTEMS:  The patient has swelling in legs, night sweats,  weight gain, hot flashes, and headaches.   PHYSICAL EXAMINATION:  GENERAL:  Well-developed, well-nourished, 55-year-  old Caucasian female in no acute distress.  VITAL SIGNS:  Blood pressure is 100/73, pulse is 76, weight 147, height  is 5 feet 0 inches.  HEENT:  Head is normocephalic and atraumatic.  Pupils equal and  reactive.  CARDIAC:  Regular rate and rhythm.  LUNGS:  Clear bilaterally.  NEUROLOGIC:  The patient is alert, oriented.  She is anxious.  She has  good muscle tone, good muscle sensation, good coordination.   ASSESSMENT:  1. Migraine with aura.  2. Strong family history of cerebral aneurysms.   PLAN:  1. The patient has requested to be scheduled for MRA.  She requests      this to be done at [facility].  We have decided today      to start her on Effexor 37.5 mg XR 1 p.o. q.a.m. #30 with 3      refills.  2. Imitrex 100 mg 1 p.o. p.r.n. migraine.   The patient will return to the clinic in 6 weeks or sooner.  We  discussed may be having a sleep study in the future.      Remonia Richter, NP    ______________________________  Tinnie Gens, MD    LR/MEDQ  D:  04/14/2009  T:  04/15/2009  Job:  [id number]

## 2010-06-11 NOTE — Op Note (Signed)
[name], [name]              ACCOUNT NO.:  [id number]   MEDICAL RECORD NO.:  [id number]          PATIENT TYPE:  AMB   LOCATION:  DAY                          FACILITY:  [facility]   PHYSICIAN:  Malachi Pro. Ambrose Mantle, M.D. DATE OF BIRTH:  01/15/1956   DATE OF PROCEDURE:  10/28/2003  DATE OF DISCHARGE:                                 OPERATIVE REPORT   PREOPERATIVE DIAGNOSIS:  Cervical dysplasia, unsatisfactory colposcopy.   POSTOPERATIVE DIAGNOSIS:  Cervical dysplasia, unsatisfactory colposcopy.   OPERATION:  Dilation and curettage, carbon dioxide laser cone.   OPERATOR:  Malachi Pro. Ambrose Mantle, M.D.   ANESTHESIA:  General anesthesia.   The patient was brought to the operating room and placed under satisfactory  general anesthesia.  She was then placed in the lithotomy position.  The  vulva was prepped with Betadine solution, and the area was draped with wet  green towels.  The remaining drapes were of the normal variety.  The cervix  was exposed using an ebonized speculum.  The cervix was prepped with 5%  acetic acid.  There was some white epithelium going up the posterior  endocervical canal.  I could not see the anterior squamocolumnar junction.  There were no vascular abnormalities.  An outline of the laser cone was made  with dots created by 10 watts of CO2 laser.  I then injected to the mini  hose with a dilute solution of Pitressin.  I used about 12-15 cc of a  solution mixed with 10 units of Pitressin and 120 cc of saline.  I then used  a SuperPulse at maximum wattage of 13 to do the conization.  I cut off the  top of the cone with scissors.  I tried to establish complete hemostasis  with a defocused laser beam at 10 watts continuous and did a fractional D&C,  reserving all tissue, reconstructed the cervix with four sutures of 0  chromic, and established hemostasis.  I then placed a little bit of Monsel's  solution in the endocervical canal.  Blood loss was about 25 cc, almost all  of it from the cone bed.  The procedure was terminated.  The patient  returned to recovery in satisfactory condition.      TFH/MEDQ  D:  10/28/2003  T:  10/28/2003  Job:  [id number]

## 2010-08-09 NOTE — Assessment & Plan Note (Unsigned)
NAMETRISTEN, [name]              ACCOUNT NO.:  [id number]  MEDICAL RECORD NO.:  [id number]           PATIENT TYPE:  LOCATION:  [facility] at [facility]           FACILITY:  PHYSICIAN:  Jaynie Collins, MD     DATE OF BIRTH:  12/17/1918  DATE OF SERVICE:  06/08/2010                                 CLINIC NOTE  The patient comes office today for yearly followup on her headaches. The patient has been doing very well overall with her Effexor 75 mg. She has been taking her Imitrex when she gets a headache.  She is using approximately 5 tablets per month.  She has approximately 1-2 severe, 3 moderate, 4 mild.  She feels that her triggers for headaches are usually weather and wine.  Otherwise, she is doing very well and is not interested in making any changes with her medication.  PHYSICAL EXAM:  GENERAL:  Well-developed, well-nourished 55 year old Caucasian female in no acute distress. VITAL SIGNS:  Blood pressure is 118/72, pulse 80, weight is 145. HEENT:  Head is normocephalic and atraumatic.  Pupils equal and reactive. NEUROLOGIC:  The patient is alert and oriented.  She has good muscle tone.  Good coordination and good sensation.  She has appropriate affect.  ASSESSMENT:  Migraine.  PLAN:  The patient will have a refill on her Effexor and her Imitrex. She will return to the clinic in 1 year or sooner as needed.  She will call if she is having any difficulties.     Remonia Richter, NP   ______________________________ Jaynie Collins, MD   LR/MEDQ  D:  06/08/2010  T:  06/09/2010  Job:  [id number]

## 2010-08-13 NOTE — Op Note (Signed)
  [name], [name]            ACCOUNT NO.:  [id number]  MEDICAL RECORD NO.:  [id number]           PATIENT TYPE:  LOCATION:                                 FACILITY:  PHYSICIAN:  Cindee Salt, M.D.            DATE OF BIRTH:  DATE OF PROCEDURE:  04/05/2010 DATE OF DISCHARGE:                              OPERATIVE REPORT   PREOPERATIVE DIAGNOSIS:  Carpal tunnel syndrome, right hand.  POSTOPERATIVE DIAGNOSIS:  Carpal tunnel syndrome, right hand.  OPERATION:  Decompression right median nerve.  SURGEON:  Cindee Salt, MD  ANESTHESIA:  Forearm-based IV regional and local infiltration.  ANESTHESIOLOGIST:  Janetta Hora. Gelene Mink, MD  HISTORY:  The patient is a 55 year old female with a history of carpal tunnel symptoms, EMG nerve conductions are positive.  She has elected to undergo surgical decompression.  Pre, peri, postoperative course have been discussed along with risks and complications.  She is aware there is no guarantee with the surgery, possibility of infection, recurrence of injury to arteries, nerves, tendons, incomplete relief of symptoms, dystrophy.  In the preoperative area, the patient is seen, the extremity marked by both the patient and surgeon, antibiotic given.  PROCEDURE:  The patient was brought to the operating room where a forearm-based IV regional anesthetic was carried out without difficulty. She was prepped using ChloraPrep, supine position, right arm free.  A 3- minute dry time was allowed.  Time-out taken confirming the patient and procedure.  A longitudinal incision was made in the palm, carried down through subcutaneous tissue.  Bleeders were electrocauterized.  Palmar fascia was split.  Superficial palmar arch identified.  The flexor tendon to the ring little finger identified to the ulnar side of the median nerve.  The carpal retinaculum was incised with sharp dissection. Right angle and Sewall retractor were placed between skin and forearm fascia.   Fascia was released for approximately a centimeter and a half proximal to the wrist crease under direct vision.  Canal was explored. Area of compression to the nerve was apparent.  No further lesions were identified.  The wound was irrigated.  The skin then closed with interrupted 5-0 Vicryl Rapide sutures.  A local infiltration of 0.25% Marcaine without epinephrine 6 mL was used.  A sterile compressive dressing and a splint to the wrist with the fingers free was applied.  On deflation of the tourniquet, all fingers immediately pinked.  She was taken to the recovery room for observation in satisfactory condition. She will be discharged home to return to the [facility] [place] in 1 week on Vicodin.          ______________________________ Cindee Salt, M.D.     GK/MEDQ  D:  04/05/2010  T:  04/06/2010  Job:  [id number]  cc:   Karie Schwalbe, MD  Electronically Signed by Cindee Salt M.D. on 08/13/2010 09:02:34 AM

## 2011-01-31 ENCOUNTER — Encounter: Payer: Self-pay | Admitting: Gastroenterology

## 2011-02-28 ENCOUNTER — Encounter: Payer: Self-pay | Admitting: Internal Medicine

## 2011-02-28 ENCOUNTER — Ambulatory Visit (INDEPENDENT_AMBULATORY_CARE_PROVIDER_SITE_OTHER): Payer: BC Managed Care – PPO | Admitting: Internal Medicine

## 2011-02-28 ENCOUNTER — Ambulatory Visit (AMBULATORY_SURGERY_CENTER): Payer: BC Managed Care – PPO | Admitting: *Deleted

## 2011-02-28 VITALS — BP 110/70 | HR 88 | Temp 98.3°F | Ht 64.0 in | Wt 156.0 lb

## 2011-02-28 DIAGNOSIS — L658 Other specified nonscarring hair loss: Secondary | ICD-10-CM | POA: Insufficient documentation

## 2011-02-28 DIAGNOSIS — Z1211 Encounter for screening for malignant neoplasm of colon: Secondary | ICD-10-CM

## 2011-02-28 DIAGNOSIS — Z86010 Personal history of colonic polyps: Secondary | ICD-10-CM

## 2011-02-28 LAB — CBC WITH DIFFERENTIAL/PLATELET
Basophils Absolute: 0.1 10*3/uL (ref 0.0–0.1)
Eosinophils Absolute: 0.3 10*3/uL (ref 0.0–0.7)
MCV: 90.2 fl (ref 78.0–100.0)
Monocytes Relative: 8.5 % (ref 3.0–12.0)
Neutrophils Relative %: 56.3 % (ref 43.0–77.0)
RDW: 13.4 % (ref 11.5–14.6)
WBC: 7.9 10*3/uL (ref 4.5–10.5)

## 2011-02-28 LAB — BASIC METABOLIC PANEL
BUN: 9 mg/dL (ref 6–23)
CO2: 29 mEq/L (ref 19–32)
Glucose, Bld: 90 mg/dL (ref 70–99)

## 2011-02-28 LAB — HEPATIC FUNCTION PANEL
ALT: 11 U/L (ref 0–35)
Albumin: 3.6 g/dL (ref 3.5–5.2)
Total Protein: 6.7 g/dL (ref 6.0–8.3)

## 2011-02-28 MED ORDER — PEG-KCL-NACL-NASULF-NA ASC-C 100 G PO SOLR: ORAL | Status: DC

## 2011-02-28 NOTE — Assessment & Plan Note (Signed)
No focal cause or infection Probably telogen effluvium but no stress or illness recently Will check labs

## 2011-02-28 NOTE — Progress Notes (Signed)
  Subjective:    Patient ID: [name], female    DOB: [birth year], 56 y.o.   MRN: [id number]  HPI Has noticed more hair falling out Sees it on the floor Hairdresser noted also  No focal area of loss No scalp problems or rash Rotates shampoos  No significant stress or illness of late  No changes in other body hair  Current Outpatient Prescriptions on File Prior to Visit  Medication Sig Dispense Refill  . estradiol (ESTRACE) 0.5 MG tablet Take 0.5 mg by mouth daily.        . fluticasone (FLONASE) 50 MCG/ACT nasal spray 2 sprays by Nasal route daily.  16 g  5  . SUMAtriptan (IMITREX) 50 MG tablet Take 50 mg by mouth. 1 tab at onset, may repeat in 2 hrs prn       . venlafaxine (EFFEXOR) 75 MG tablet Take 75 mg by mouth. 2 sprays each nostril daily for allergies as needed       . Vitamin D, Ergocalciferol, (DRISDOL) 50000 UNITS CAPS Take 1 capsule (50,000 Units total) by mouth every 30 (thirty) days.  12 capsule  0    No Known Allergies  Past Medical History  Diagnosis Date  . Migraines   . IBS (irritable bowel syndrome)   . Osteoarthritis   . AR (allergic rhinitis)     Past Surgical History  Procedure Date  . Tonsillectomy 12/76  . Cholecystectomy 4/85  . Vaginal delivery     x 2  . Tubal ligation 1990  . Dilation and curettage of uterus 10/05  . Carpal tunnel release 3/12    right. Left done 2010. Dr Merlyn Lot    Family History  Problem Relation Age of Onset  . Macular degeneration Mother   . Hypertension Father   . Diabetes Father   . Hypertension Sister   . Cerebral aneurysm Sister   . Cancer Maternal Aunt     breast   . Diabetes Paternal Grandfather     History   Social History  . Marital Status: Married    Spouse Name: N/A    Number of Children: 2  . 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 Smoking status: Former Smoker -- 12 years  . Smokeless tobacco: Not on file  .  Alcohol Use: Yes     Rare  . Drug Use: Not on file  . Sexually Active: Not on file   Other Topics Concern  . Not on file   Social History Narrative  . No narrative on file   Review of Systems Weight is up 11#---this surprised her Still does regular exercise Hasn't felt right recently Strep in the school but no clear sore throat Uses flonase and humidifier but still gets bloody nasal secretions in AM    Objective:   Physical Exam  Constitutional: She appears well-developed and well-nourished. No distress.  HENT:       No scalp lesions No focal hair loss ?some thinning  Neck: No thyromegaly present.  Skin:       No rashes and body hair appears normal          Assessment & Plan:

## 2011-03-01 ENCOUNTER — Encounter: Payer: Self-pay | Admitting: Gastroenterology

## 2011-03-14 ENCOUNTER — Encounter: Payer: Self-pay | Admitting: Gastroenterology

## 2011-03-14 ENCOUNTER — Ambulatory Visit (AMBULATORY_SURGERY_CENTER): Payer: BC Managed Care – PPO | Admitting: Gastroenterology

## 2011-03-14 DIAGNOSIS — Z860100 Personal history of colon polyps, unspecified: Secondary | ICD-10-CM | POA: Insufficient documentation

## 2011-03-14 DIAGNOSIS — Z1211 Encounter for screening for malignant neoplasm of colon: Secondary | ICD-10-CM | POA: Insufficient documentation

## 2011-03-14 DIAGNOSIS — K5730 Diverticulosis of large intestine without perforation or abscess without bleeding: Secondary | ICD-10-CM

## 2011-03-14 MED ORDER — SODIUM CHLORIDE 0.9 % IV SOLN: 500.0000 mL | INTRAVENOUS | Status: DC

## 2011-03-14 NOTE — Progress Notes (Signed)
Patient did not experience any of the following events: a burn prior to discharge; a fall within the facility; wrong site/side/patient/procedure/implant event; or a hospital transfer or hospital admission upon discharge from the facility. (G8907) Patient did not have preoperative order for IV antibiotic SSI prophylaxis. (G8918)  

## 2011-03-14 NOTE — Patient Instructions (Signed)
YOU HAD AN ENDOSCOPIC PROCEDURE TODAY AT THE [facility]: Refer to the procedure report that was given to you for any specific questions about what was found during the examination.  If the procedure report does not answer your questions, please call your gastroenterologist to clarify.  If you requested that your care partner not be given the details of your procedure findings, then the procedure report has been included in a sealed envelope for you to review at your convenience later.  YOU SHOULD EXPECT: Some feelings of bloating in the abdomen. Passage of more gas than usual.  Walking can help get rid of the air that was put into your GI tract during the procedure and reduce the bloating. If you had a lower endoscopy (such as a colonoscopy or flexible sigmoidoscopy) you may notice spotting of blood in your stool or on the toilet paper. If you underwent a bowel prep for your procedure, then you may not have a normal bowel movement for a few days.  DIET: Your first meal following the procedure should be a light meal and then it is ok to progress to your normal diet.  A half-sandwich or bowl of soup is an example of a good first meal.  Heavy or fried foods are harder to digest and may make you feel nauseous or bloated.  Likewise meals heavy in dairy and vegetables can cause extra gas to form and this can also increase the bloating.  Drink plenty of fluids but you should avoid alcoholic beverages for 24 hours.  ACTIVITY: Your care partner should take you home directly after the procedure.  You should plan to take it easy, moving slowly for the rest of the day.  You can resume normal activity the day after the procedure however you should NOT DRIVE or use heavy machinery for 24 hours (because of the sedation medicines used during the test).    SYMPTOMS TO REPORT IMMEDIATELY: A gastroenterologist can be reached at any hour.  During normal business hours, 8:30 AM to 5:00 PM Monday through Friday,  call [phone].  After hours and on weekends, please call the GI answering service at [phone] who will take a message and have the physician on call contact you.   Following lower endoscopy (colonoscopy or flexible sigmoidoscopy):  Excessive amounts of blood in the stool  Significant tenderness or worsening of abdominal pains  Swelling of the abdomen that is new, acute  Fever of 100F or higher  Following upper endoscopy (EGD)  Vomiting of blood or coffee ground material  New chest pain or pain under the shoulder blades  Painful or persistently difficult swallowing  New shortness of breath  Fever of 100F or higher  Black, tarry-looking stools  FOLLOW UP: If any biopsies were taken you will be contacted by phone or by letter within the next 1-3 weeks.  Call your gastroenterologist if you have not heard about the biopsies in 3 weeks.  Our staff will call the home number listed on your records the next business day following your procedure to check on you and address any questions or concerns that you may have at that time regarding the information given to you following your procedure. This is a courtesy call and so if there is no answer at the home number and we have not heard from you through the emergency physician on call, we will assume that you have returned to your regular daily activities without incident.  SIGNATURES/CONFIDENTIALITY: You and/or your care   partner have signed paperwork which will be entered into your electronic medical record.  These signatures attest to the fact that that the information above on your After Visit Summary has been reviewed and is understood.  Full responsibility of the confidentiality of this discharge information lies with you and/or your care-partner. YOU HAD AN ENDOSCOPIC PROCEDURE TODAY AT THE [facility]: Refer to the procedure report that was given to you for any specific questions about what was found during the  examination.  If the procedure report does not answer your questions, please call your gastroenterologist to clarify.  If you requested that your care partner not be given the details of your procedure findings, then the procedure report has been included in a sealed envelope for you to review at your convenience later.  YOU SHOULD EXPECT: Some feelings of bloating in the abdomen. Passage of more gas than usual.  Walking can help get rid of the air that was put into your GI tract during the procedure and reduce the bloating. If you had a lower endoscopy (such as a colonoscopy or flexible sigmoidoscopy) you may notice spotting of blood in your stool or on the toilet paper. If you underwent a bowel prep for your procedure, then you may not have a normal bowel movement for a few days.  DIET: Your first meal following the procedure should be a light meal and then it is ok to progress to your normal diet.  A half-sandwich or bowl of soup is an example of a good first meal.  Heavy or fried foods are harder to digest and may make you feel nauseous or bloated.  Likewise meals heavy in dairy and vegetables can cause extra gas to form and this can also increase the bloating.  Drink plenty of fluids but you should avoid alcoholic beverages for 24 hours.  ACTIVITY: Your care partner should take you home directly after the procedure.  You should plan to take it easy, moving slowly for the rest of the day.  You can resume normal activity the day after the procedure however you should NOT DRIVE or use heavy machinery for 24 hours (because of the sedation medicines used during the test).    SYMPTOMS TO REPORT IMMEDIATELY: A gastroenterologist can be reached at any hour.  During normal business hours, 8:30 AM to 5:00 PM Monday through Friday, call [phone].  After hours and on weekends, please call the GI answering service at [phone] who will take a message and have the physician on call contact  you.   Following lower endoscopy (colonoscopy or flexible sigmoidoscopy):  Excessive amounts of blood in the stool  Significant tenderness or worsening of abdominal pains  Swelling of the abdomen that is new, acute  Fever of 100F or higher  Following upper endoscopy (EGD)  Vomiting of blood or coffee ground material  New chest pain or pain under the shoulder blades  Painful or persistently difficult swallowing  New shortness of breath  Fever of 100F or higher  Black, tarry-looking stools  FOLLOW UP: If any biopsies were taken you will be contacted by phone or by letter within the next 1-3 weeks.  Call your gastroenterologist if you have not heard about the biopsies in 3 weeks.  Our staff will call the home number listed on your records the next business day following your procedure to check on you and address any questions or concerns that you may have at that time regarding the information given to   you following your procedure. This is a courtesy call and so if there is no answer at the home number and we have not heard from you through the emergency physician on call, we will assume that you have returned to your regular daily activities without incident.  SIGNATURES/CONFIDENTIALITY: You and/or your care partner have signed paperwork which will be entered into your electronic medical record.  These signatures attest to the fact that that the information above on your After Visit Summary has been reviewed and is understood.  Full responsibility of the confidentiality of this discharge information lies with you and/or your care-partner.  

## 2011-03-14 NOTE — Op Note (Signed)
[facility] [address] [place], [place]  [address]  COLONOSCOPY PROCEDURE REPORT  PATIENT:  [name], [name]  MR#:  [id number] BIRTHDATE:  Apr 04, 1955, 55 yrs. old  GENDER:  female ENDOSCOPIST:  Vania Rea. Jarold Motto, MD, [facility] REF. BY: PROCEDURE DATE:  03/14/2011 PROCEDURE:  Diagnostic Colonoscopy ASA CLASS:  Class II INDICATIONS:  history of pre-cancerous (adenomatous) colon polyps  MEDICATIONS:   propofol (Diprivan) 200 mg IV  DESCRIPTION OF PROCEDURE:   After the risks and benefits and of the procedure were explained, informed consent was obtained. Digital rectal exam was performed and revealed no abnormalities. The LB 180AL [id number] endoscope was introduced through the anus and advanced to the cecum, which was identified by both the appendix and ileocecal valve.  The quality of the prep was excellent, using MoviPrep.  The instrument was then slowly withdrawn as the colon was fully examined. <<PROCEDUREIMAGES>>  FINDINGS:  There were mild diverticular changes in left colon. diverticulosis was found.  No polyps or cancers were seen.  This was otherwise a normal examination of the colon.   Retroflexed views in the rectum revealed no abnormalities.    The scope was then withdrawn from the patient and the procedure completed.  COMPLICATIONS:  None ENDOSCOPIC IMPRESSION: 1) Diverticulosis,mild,left sided diverticulosis 2) No polyps or cancers 3) Otherwise normal examination RECOMMENDATIONS: 1) Repeat Colonoscopy in 5 years. 2) High fiber diet.  REPEAT EXAM:  No  ______________________________ Vania Rea. Jarold Motto, MD, [facility]  CC:  Karie Schwalbe, MD  n. Rosalie DoctorMarland Kitchen   Vania Rea. Patterson at 03/14/2011 09:00 AM  Drue Flirt, [id number]

## 2011-03-15 ENCOUNTER — Telehealth: Payer: Self-pay | Admitting: *Deleted

## 2011-03-15 NOTE — Telephone Encounter (Signed)
  Follow up Call-  Call back number 03/14/2011  Post procedure Call Back phone  # [phone]  Permission to leave phone message Yes     Patient questions:  Do you have a fever, pain , or abdominal swelling? no Pain Score  0 *  Have you tolerated food without any problems? yes  Have you been able to return to your normal activities? yes  Do you have any questions about your discharge instructions: Diet   no Medications  no Follow up visit  no  Do you have questions or concerns about your Care? no  Actions: * If pain score is 4 or above: No action needed, pain <4.

## 2011-04-20 ENCOUNTER — Other Ambulatory Visit: Payer: Self-pay | Admitting: Internal Medicine

## 2011-06-02 ENCOUNTER — Telehealth: Payer: Self-pay | Admitting: *Deleted

## 2011-06-02 DIAGNOSIS — G43909 Migraine, unspecified, not intractable, without status migrainosus: Secondary | ICD-10-CM

## 2011-06-02 MED ORDER — SUMATRIPTAN SUCCINATE 50 MG PO TABS: 50.0000 mg | ORAL_TABLET | ORAL | Status: DC | PRN

## 2011-06-02 MED ORDER — VENLAFAXINE HCL 75 MG PO TABS: 75.0000 mg | ORAL_TABLET | Freq: Every day | ORAL | Status: DC

## 2011-06-02 NOTE — Telephone Encounter (Signed)
Patient needs refills to last until her next appointment.

## 2011-06-13 ENCOUNTER — Encounter: Payer: Self-pay | Admitting: Internal Medicine

## 2011-06-14 ENCOUNTER — Encounter: Payer: Self-pay | Admitting: *Deleted

## 2011-06-16 ENCOUNTER — Encounter: Payer: Self-pay | Admitting: Internal Medicine

## 2011-06-21 ENCOUNTER — Ambulatory Visit (INDEPENDENT_AMBULATORY_CARE_PROVIDER_SITE_OTHER): Payer: BC Managed Care – PPO | Admitting: Nurse Practitioner

## 2011-06-21 ENCOUNTER — Encounter: Payer: Self-pay | Admitting: Nurse Practitioner

## 2011-06-21 VITALS — BP 110/74 | HR 75 | Ht 63.0 in | Wt 150.0 lb

## 2011-06-21 DIAGNOSIS — G43909 Migraine, unspecified, not intractable, without status migrainosus: Secondary | ICD-10-CM

## 2011-06-21 MED ORDER — ESZOPICLONE 2 MG PO TABS: 2.0000 mg | ORAL_TABLET | Freq: Every day | ORAL | Status: DC

## 2011-06-21 MED ORDER — SUMATRIPTAN SUCCINATE 100 MG PO TABS: 100.0000 mg | ORAL_TABLET | Freq: Once | ORAL | Status: DC | PRN

## 2011-06-21 MED ORDER — VENLAFAXINE HCL ER 37.5 MG PO CP24: 37.5000 mg | ORAL_CAPSULE | Freq: Three times a day (TID) | ORAL | Status: AC

## 2011-06-21 NOTE — Progress Notes (Signed)
S: Pt is in office today for yearly follow up on migraine headaches. Dr Ambrose Mantle has been cutting back her estrogen dose which has been making her very irritable, have insomnia, and hair loss. She saw her PCP who drew labs which were all negative. She would like to have something to help her sleep and increase her Effexor dose. She will remain on Imitrex when she gets a migraine.  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 If this dose is not high enough we will increase. She will be given Lunesta for sleep as needed and she will remain on Imitrex. RTC one year

## 2011-06-21 NOTE — Patient Instructions (Signed)

## 2011-06-22 ENCOUNTER — Encounter: Payer: Self-pay | Admitting: Internal Medicine

## 2011-07-13 ENCOUNTER — Telehealth: Payer: Self-pay | Admitting: *Deleted

## 2011-07-13 MED ORDER — ZOLPIDEM TARTRATE 10 MG PO TABS: 10.0000 mg | ORAL_TABLET | Freq: Every evening | ORAL | Status: DC | PRN

## 2011-07-13 NOTE — Telephone Encounter (Signed)
Patient is needing to change her medication from Lunesta to Ambien as Alfonso Patten is not covered under her insurance plan.

## 2012-01-28 ENCOUNTER — Ambulatory Visit (INDEPENDENT_AMBULATORY_CARE_PROVIDER_SITE_OTHER): Payer: BC Managed Care – PPO | Admitting: Internal Medicine

## 2012-01-28 ENCOUNTER — Encounter: Payer: Self-pay | Admitting: Internal Medicine

## 2012-01-28 VITALS — BP 120/70 | HR 64 | Temp 98.2°F | Resp 18 | Wt 151.8 lb

## 2012-01-28 DIAGNOSIS — J069 Acute upper respiratory infection, unspecified: Secondary | ICD-10-CM

## 2012-01-28 MED ORDER — AMOXICILLIN 875 MG PO TABS: 875.0000 mg | ORAL_TABLET | Freq: Two times a day (BID) | ORAL | Status: DC

## 2012-01-28 NOTE — Progress Notes (Signed)
Subjective:    Patient ID: [name], female    DOB: September 12, 1955, 57 y.o.   MRN: [id number]  HPI  57 year old white female complains of upper respiratory congestion and cough since Wednesday. She denies any fever or chills. She denies any shortness of breath. She is a grandmother and she was recently exposed to grandchildren that have similar upper respiratory illness. She also complains of hoarseness. Her nasal secretions have been somewhat yellowish in color.   Review of Systems See HPI  Past Medical History  Diagnosis Date  . Migraines   . IBS (irritable bowel syndrome)   . Osteoarthritis   . AR (allergic rhinitis)   . Osteopenia     History   Social History  . Marital Status: Married    Spouse Name: N/A    Number of Children: 2  . 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 Smoking status: Former Smoker -- 12 years  . Smokeless tobacco: Not on file  . Alcohol Use: 1.0 oz/week    2 drink(s) per week     Comment: Rare  . Drug Use: No  . Sexually Active: Yes -- Female partner(s)   Other Topics Concern  . Not on file   Social History Narrative  . No narrative on file    Past Surgical History  Procedure Date  . Tonsillectomy 12/76  . Cholecystectomy 4/85  . Vaginal delivery     x 2  . Tubal ligation 1990  . Dilation and curettage of uterus 10/05  . Carpal tunnel release 3/12    right. Left done 2010. Dr Merlyn Lot and left side 2012  . Colonoscopy     Family History  Problem Relation Age of Onset  . Macular degeneration Mother   . Hypertension Father   . Diabetes Father   . Hypertension Sister   . Cerebral aneurysm Sister   . Cancer Maternal Aunt     breast   . Diabetes Paternal Grandfather   . Colon cancer Neg Hx   . Esophageal cancer Neg Hx   . Stomach cancer Neg Hx   . Rectal cancer Neg Hx     No Known Allergies  Current Outpatient Prescriptions on File Prior to Visit    Medication Sig Dispense Refill  . estradiol (ESTRACE) 0.5 MG tablet Take 0.5 mg by mouth daily.        . fluticasone (FLONASE) 50 MCG/ACT nasal spray instill 2 sprays into each nostril once daily  16 g  5  . SUMAtriptan (IMITREX) 100 MG tablet Take 1 tablet (100 mg total) by mouth once as needed for migraine.  9 tablet  11  . venlafaxine XR (EFFEXOR XR) 37.5 MG 24 hr capsule Take 1 capsule (37.5 mg total) by mouth 3 (three) times daily. Take all 3 tabs in morning  90 capsule  11  . Vitamin D, Ergocalciferol, (DRISDOL) 50000 UNITS CAPS Take 1 capsule (50,000 Units total) by mouth every 30 (thirty) days.  12 capsule  0  . DiphenhydrAMINE HCl (BENADRYL PO) Take by mouth.      . zolpidem (AMBIEN) 10 MG tablet Take 1 tablet (10 mg total) by mouth at bedtime as needed for sleep.  30 tablet  3    BP 120/70  Pulse 64  Temp 98.2 F (36.8 C) (Oral)  Resp 18  Wt 151 lb 12.8 oz (68.856 kg)  Objective:   Physical Exam  Constitutional: She appears well-developed and well-nourished.  HENT:  Head: Normocephalic and atraumatic.  Right Ear: External ear normal.  Left Ear: External ear normal.       Oropharyngeal erythema  Neck: Neck supple.       No neck tenderness  Cardiovascular: Normal rate, regular rhythm and normal heart sounds.   Pulmonary/Chest: Effort normal and breath sounds normal. She has no wheezes.  Psychiatric: She has a normal mood and affect. Her behavior is normal.          Assessment & Plan:

## 2012-01-28 NOTE — Assessment & Plan Note (Signed)
57 year old white female with probable viral URI. I recommended symptomatic treatment (Gargle with warm salt water and use intranasal saline). Discussed signs and symptoms of bacterial sinusitis. Patient given prescription for amoxicillin 875 to have on hand in case her symptoms worsen. Patient understands to followup with her primary care physician within 1 to 2 weeks if she has persistent or worsening symptoms.

## 2012-01-28 NOTE — Patient Instructions (Addendum)
Gargle with warm salt water 2-3 times per day Use nasal saline as directed Drink plenty of fluids Follow up with your PCP if you are not feeling better within 1-2 weeks

## 2012-01-30 ENCOUNTER — Telehealth: Payer: Self-pay | Admitting: Internal Medicine

## 2012-01-30 NOTE — Telephone Encounter (Signed)
[facility] Triage Call Report Triage Record Num: [id number] Operator: Chevis Pretty Patient Name: [name] Call Date & Time: 01/28/2012 10:35:18AM Patient Phone: [phone] PCP: Patient Gender: Female PCP Fax : Patient DOB: 02-11-1955 Practice Name: [facility] Reason for Call: Caller: Mamye/Patient; PCP: Tillman Abide (Family Practice); CB#: [phone]; Call regarding Sinus pain; onset 01/27/12. Emergent symptoms denied per URI protocol; advised appt within 24 hours. Appt scheduled 1145 01/28/12 with Dr. Artist Pais at [place] office. Protocol(s) Used: Upper Respiratory Infection (URI) Recommended Outcome per Protocol: See Provider within 24 hours Reason for Outcome: Facial pain (fullness, pressure, worsens with bending over), frontal headache, yellow-green nasal discharge AND any temperature elevation Care Advice: ~ Use a cool mist humidifier to moisten air. Be sure to clean according to manufacturer's instructions. ~ See provider in 8 hours if redness, swelling and tenderness develops above or below eyes/near ears/over sinuses ~ Consider use of a saline nasal spray per package directions to help relieve nasal congestion. ~ SYMPTOM / CONDITION MANAGEMENT ~ CAUTIONS A warm, moist compress placed on face, over eyes for 15 to 20 minutes, 5 to 6 times a day, may help relieve the congestion. ~ Consider nonprescription decongestant (Sudafed, Drixoral) for relief of symptoms after checking with a provider, especially if there is a history of hypertension, hyperthyroidism, heart disease, diabetes, glaucoma, urinary retention caused by prostatic hypertrophy. ~ Analgesic/Antipyretic Advice - Acetaminophen: Consider acetaminophen as directed on label or by pharmacist/provider for pain or fever PRECAUTIONS: - Use if there is no history of liver disease, alcoholism, or intake of three or more alcohol drinks per day - Only if approved by provider during pregnancy or when  breastfeeding - During pregnancy, acetaminophen should not be taken more than 3 consecutive days without telling provider - Do not exceed recommended dose or frequency ~ If pregnancy is known or suspected, get advice from provider before using any nonprescription medication other than acetaminophen ~ Analgesic/Antipyretic Advice - NSAIDs: Consider aspirin, ibuprofen, naproxen or ketoprofen for pain or fever as directed on label or by pharmacist/provider. PRECAUTIONS: - If over 62 years of age, should not take longer than 1 week without consulting provider. EXCEPTIONS: - Should not be used if taking blood thinners or have bleeding problems. - Do not use if have history of sensitivity/allergy to any of these medications; or history of cardiovascular, ulcer, kidney, liver disease or diabetes unless approved by provider. - Do not exceed recommended dose or frequency. ~ Nasal Irrigation To Relieve Congestion: - Wash hands with soap and water. - Add 1/2 teaspoon salt to 1 cup warm water to make saltwater solution. - Use a bulb syringe to draw up the saltwater solution. Turn the bulb upright to squeeze out any air. Fill the syringe until is full. ~ 01/28/2012 10:40:30AM Page 1 of 2 [id number] [facility] Triage Call Report Patient Name: [name] continuation page/s - Bend over sink bowl and lean slightly toward sink. Gently insert the tip of the syringe into nostril about 1/2 inch. Point tip toward outer corner of eye and GENTLY squeeze bulb to squirt saltwater into nostril. Forceful irrigation to clear sinuses requires the use of sterile water or saline. - Let solution drain from nose. Solution may come out of the other nostril or even your mouth. Do two full syringes of solution in each nostril. - Rinse syringe in clean water several times. Be sure water comes out clear. Dry the bulb syringe and store in a container or plastic bag. 01/28/2012 10:40:30AM Page 2 of  2  [id number]

## 2012-03-02 ENCOUNTER — Telehealth: Payer: Self-pay

## 2012-03-02 NOTE — Telephone Encounter (Signed)
Pt left v/m needs letter to be excused from jury duty due to IBS. Pt request call back by 03/07/12.Please advise.

## 2012-03-06 NOTE — Telephone Encounter (Signed)
I can write a letter though we haven't spoken much about her IBS and so I am not sure if it is totally appropriate to use it as reason for not serving  Is she asking for permanent deferral---or just wants to push it back some Is she concerned the stress will make her need to use the bathroom?

## 2012-03-06 NOTE — Telephone Encounter (Signed)
Spoke with patient and she is scared the stress will send her to the bathroom, per pt its not as bad, but comes every few days, she just wants it deferred permanently.

## 2012-03-09 ENCOUNTER — Encounter: Payer: Self-pay | Admitting: Internal Medicine

## 2012-03-09 DIAGNOSIS — Z0279 Encounter for issue of other medical certificate: Secondary | ICD-10-CM

## 2012-03-09 NOTE — Telephone Encounter (Signed)
Letter done $20 charge 

## 2012-03-12 NOTE — Telephone Encounter (Signed)
I notified patient letter is ready and of charge.  Patient will pick up form.

## 2012-07-05 ENCOUNTER — Encounter: Payer: Self-pay | Admitting: Internal Medicine

## 2012-07-06 ENCOUNTER — Encounter: Payer: Self-pay | Admitting: *Deleted

## 2012-07-17 ENCOUNTER — Encounter: Payer: Self-pay | Admitting: Nurse Practitioner

## 2012-07-17 ENCOUNTER — Ambulatory Visit (INDEPENDENT_AMBULATORY_CARE_PROVIDER_SITE_OTHER): Payer: BC Managed Care – PPO | Admitting: Nurse Practitioner

## 2012-07-17 VITALS — BP 118/79 | HR 78 | Ht 63.5 in | Wt 154.0 lb

## 2012-07-17 DIAGNOSIS — G43909 Migraine, unspecified, not intractable, without status migrainosus: Secondary | ICD-10-CM

## 2012-07-17 MED ORDER — SUMATRIPTAN SUCCINATE 100 MG PO TABS: 100.0000 mg | ORAL_TABLET | ORAL | Status: DC | PRN

## 2012-07-17 MED ORDER — VENLAFAXINE HCL ER 37.5 MG PO CP24: 37.5000 mg | ORAL_CAPSULE | Freq: Three times a day (TID) | ORAL | Status: DC

## 2012-07-17 NOTE — Patient Instructions (Signed)
Migraine Headache A migraine headache is an intense, throbbing pain on one or both sides of your head. A migraine can last for 30 minutes to several hours. CAUSES  The exact cause of a migraine headache is not always known. However, a migraine may be caused when nerves in the brain become irritated and release chemicals that cause inflammation. This causes pain. SYMPTOMS  Pain on one or both sides of your head.  Pulsating or throbbing pain.  Severe pain that prevents daily activities.  Pain that is aggravated by any physical activity.  Nausea, vomiting, or both.  Dizziness.  Pain with exposure to bright lights, loud noises, or activity.  General sensitivity to bright lights, loud noises, or smells. Before you get a migraine, you may get warning signs that a migraine is coming (aura). An aura may include:  Seeing flashing lights.  Seeing bright spots, halos, or zig-zag lines.  Having tunnel vision or blurred vision.  Having feelings of numbness or tingling.  Having trouble talking.  Having muscle weakness. MIGRAINE TRIGGERS  Alcohol.  Smoking.  Stress.  Menstruation.  Aged cheeses.  Foods or drinks that contain nitrates, glutamate, aspartame, or tyramine.  Lack of sleep.  Chocolate.  Caffeine.  Hunger.  Physical exertion.  Fatigue.  Medicines used to treat chest pain (nitroglycerine), birth control pills, estrogen, and some blood pressure medicines. DIAGNOSIS  A migraine headache is often diagnosed based on:  Symptoms.  Physical examination.  A CT scan or MRI of your head. TREATMENT Medicines may be given for pain and nausea. Medicines can also be given to help prevent recurrent migraines.  HOME CARE INSTRUCTIONS  Only take over-the-counter or prescription medicines for pain or discomfort as directed by your caregiver. The use of long-term narcotics is not recommended.  Lie down in a dark, quiet room when you have a migraine.  Keep a journal  to find out what may trigger your migraine headaches. For example, write down:  What you eat and drink.  How much sleep you get.  Any change to your diet or medicines.  Limit alcohol consumption.  Quit smoking if you smoke.  Get 7 to 9 hours of sleep, or as recommended by your caregiver.  Limit stress.  Keep lights dim if bright lights bother you and make your migraines worse. SEEK IMMEDIATE MEDICAL CARE IF:   Your migraine becomes severe.  You have a fever.  You have a stiff neck.  You have vision loss.  You have muscular weakness or loss of muscle control.  You start losing your balance or have trouble walking.  You feel faint or pass out.  You have severe symptoms that are different from your first symptoms. MAKE SURE YOU:   Understand these instructions.  Will watch your condition.  Will get help right away if you are not doing well or get worse. Document Released: 01/10/2005 Document Revised: 04/04/2011 Document Reviewed: 12/31/2010 [employer] Patient Information 2014 [employer].  

## 2012-07-17 NOTE — Progress Notes (Signed)
S: Pt here for yearly follow up on her migraine headaches. She is doing very well over all. She recently saw Dr Ambrose Mantle for her GYN exam. She is stable on her Effexor, Ambien, Imitrex. Her menses is a real trigger for migraine.  O: alert, oriented, NAD Cardiac: RRR Lungs: Clear Skin: warm and dry  A: Migraine Insomnia  P: Refill Effexor, Imitrex Call in Ambien 10mg  one po qhs prn sleep # 30/ 3 refills RTC one year or prn

## 2012-07-17 NOTE — Progress Notes (Signed)
Here today for headache follow-up.  Needs refills on all her headache meds.

## 2012-11-01 ENCOUNTER — Ambulatory Visit (INDEPENDENT_AMBULATORY_CARE_PROVIDER_SITE_OTHER): Payer: BC Managed Care – PPO | Admitting: Family Medicine

## 2012-11-01 ENCOUNTER — Encounter: Payer: Self-pay | Admitting: Family Medicine

## 2012-11-01 VITALS — BP 116/70 | HR 82 | Temp 99.0°F | Ht 63.5 in | Wt 153.2 lb

## 2012-11-01 DIAGNOSIS — M545 Low back pain: Secondary | ICD-10-CM

## 2012-11-01 MED ORDER — METAXALONE 800 MG PO TABS: 800.0000 mg | ORAL_TABLET | Freq: Three times a day (TID) | ORAL | Status: DC | PRN

## 2012-11-01 MED ORDER — DICLOFENAC SODIUM 75 MG PO TBEC: 75.0000 mg | DELAYED_RELEASE_TABLET | Freq: Two times a day (BID) | ORAL | Status: DC

## 2012-11-01 NOTE — Assessment & Plan Note (Signed)
No sign of radiculopathy. Treat with NSAIDs, muscle relaxant. Heat, massage, stretching

## 2012-11-01 NOTE — Patient Instructions (Addendum)
Take diclofenac with food for pain and inflammation. Can use muscle relaxant as needed for spasming. Gentle stretching, heat and massage. Follow up if not improving in 2 weeks.

## 2012-11-01 NOTE — Progress Notes (Signed)
  Subjective:    Patient ID: [name], female    DOB: [birth year], 57 y.o.   MRN: [id number]  HPI  57 year old female pt of Dr. Alphonsus Sias  presents with  new onset  Pain in left lower back.  Started after bending over to standing up. Radiates to left buttock.  Better with squating or lying down, not worse with beding forward, more pain with leaning back. Improved with heat and ice and when wakes up earlier in the day... Worsens as day progresses. Using ibuprofen 800 mg three times a day... Helped some. Has tried meloxicam... Did not help as much. No numbness. No weakness in legs. No fever. No incontinence.  No hx of back problems. NO past surgery.   Review of Systems  Constitutional: Negative for fever and fatigue.  HENT: Negative for ear pain.   Eyes: Negative for pain.  Respiratory: Negative for chest tightness and shortness of breath.   Cardiovascular: Negative for chest pain, palpitations and leg swelling.  Gastrointestinal: Negative for abdominal pain.  Genitourinary: Negative for dysuria and flank pain.       Objective:   Physical Exam  Constitutional: Vital signs are normal. She appears well-developed and well-nourished. She is cooperative.  Non-toxic appearance. She does not appear ill. No distress.  HENT:  Head: Normocephalic.  Right Ear: Hearing, tympanic membrane, external ear and ear canal normal. Tympanic membrane is not erythematous, not retracted and not bulging.  Left Ear: Hearing, tympanic membrane, external ear and ear canal normal. Tympanic membrane is not erythematous, not retracted and not bulging.  Nose: No mucosal edema or rhinorrhea. Right sinus exhibits no maxillary sinus tenderness and no frontal sinus tenderness. Left sinus exhibits no maxillary sinus tenderness and no frontal sinus tenderness.  Mouth/Throat: Uvula is midline, oropharynx is clear and moist and mucous membranes are normal.  Eyes: Conjunctivae, EOM and lids are normal. Pupils are  equal, round, and reactive to light. Lids are everted and swept, no foreign bodies found.  Neck: Trachea normal and normal range of motion. Neck supple. Carotid bruit is not present. No mass and no thyromegaly present.  Cardiovascular: Normal rate, regular rhythm, S1 normal, S2 normal, normal heart sounds, intact distal pulses and normal pulses.  Exam reveals no gallop and no friction rub.   No murmur heard. Pulmonary/Chest: Effort normal and breath sounds normal. Not tachypneic. No respiratory distress. She has no decreased breath sounds. She has no wheezes. She has no rhonchi. She has no rales.  Abdominal: Soft. Normal appearance and bowel sounds are normal. There is no tenderness.  Musculoskeletal:       Thoracic back: Normal.       Lumbar back: She exhibits decreased range of motion and tenderness. She exhibits no bony tenderness.  Neg slr and spurling's  Neurological: She is alert.  Skin: Skin is warm, dry and intact. No rash noted.  Psychiatric: Her speech is normal and behavior is normal. Judgment and thought content normal. Her mood appears not anxious. Cognition and memory are normal. She does not exhibit a depressed mood.          Assessment & Plan:

## 2012-11-15 ENCOUNTER — Other Ambulatory Visit: Payer: Self-pay | Admitting: Internal Medicine

## 2012-12-19 ENCOUNTER — Other Ambulatory Visit: Payer: Self-pay | Admitting: Internal Medicine

## 2013-02-01 ENCOUNTER — Telehealth: Payer: Self-pay | Admitting: *Deleted

## 2013-02-01 NOTE — Telephone Encounter (Signed)
Patient is requesting refill of Ambien 10mg. 

## 2013-03-05 ENCOUNTER — Telehealth: Payer: Self-pay | Admitting: Internal Medicine

## 2013-03-05 ENCOUNTER — Ambulatory Visit (INDEPENDENT_AMBULATORY_CARE_PROVIDER_SITE_OTHER): Payer: BC Managed Care – PPO | Admitting: Internal Medicine

## 2013-03-05 ENCOUNTER — Ambulatory Visit (INDEPENDENT_AMBULATORY_CARE_PROVIDER_SITE_OTHER)
Admission: RE | Admit: 2013-03-05 | Discharge: 2013-03-05 | Disposition: A | Discharge status: Home / Self Care | Payer: BC Managed Care – PPO | Source: Ambulatory Visit | Attending: Internal Medicine | Admitting: Internal Medicine

## 2013-03-05 ENCOUNTER — Encounter: Payer: Self-pay | Admitting: Internal Medicine

## 2013-03-05 VITALS — BP 118/78 | HR 80 | Temp 98.0°F | Wt 152.0 lb

## 2013-03-05 DIAGNOSIS — M25551 Pain in right hip: Secondary | ICD-10-CM

## 2013-03-05 DIAGNOSIS — M25559 Pain in unspecified hip: Secondary | ICD-10-CM

## 2013-03-05 NOTE — Telephone Encounter (Signed)
If she can just have a light breakfast by 7AM, we should be able to do the blood work ~3 hours without eating is all we need

## 2013-03-05 NOTE — Telephone Encounter (Signed)
Spoke with patient and advised results   

## 2013-03-05 NOTE — Progress Notes (Signed)
Pre-visit discussion using our clinic review tool. No additional management support is needed unless otherwise documented below in the visit note.  

## 2013-03-05 NOTE — Progress Notes (Signed)
   Subjective:    Patient ID: [name], female    DOB: [birth year], 58 y.o.   MRN: [id number]  HPI Hasn't been feeling good for a few months Started with some right low back pain Improved  Tried to limit lifting granddaughter and was doing some stretching  Now this has moved around to her hip area---worst when sitting Feels it inside pelvis--throbbing at times Notices when she twists her body  Worried about ovary Did start her period from provera stopping Spoke to gyn and will go there if no answers  No radiation down leg No leg weakness Has alternated tylenol and ibuprofen---may just ease off the pain Heating pad works the best  Current Outpatient Prescriptions on File Prior to Visit  Medication Sig Dispense Refill  . estradiol (ESTRACE) 0.5 MG tablet Take 0.5 mg by mouth daily.        . medroxyPROGESTERone (PROVERA) 5 MG tablet Take 2.5 mg by mouth daily.        No current facility-administered medications on file prior to visit.    No Known Allergies  Past Medical History  Diagnosis Date  . Migraines   . IBS (irritable bowel syndrome)   . Osteoarthritis   . AR (allergic rhinitis)   . Osteopenia     Past Surgical History  Procedure Laterality Date  . Tonsillectomy  12/76  . Cholecystectomy  4/85  . Vaginal delivery      x 2  . Tubal ligation  1990  . Dilation and curettage of uterus  10/05  . Carpal tunnel release  3/12    right. Left done 2010. Dr Fredna Dow and left side 2012  . Colonoscopy      Family History  Problem Relation Age of Onset  . Macular degeneration Mother   . Hypertension Father   . Diabetes Father   . Hypertension Sister   . Cerebral aneurysm Sister   . Cancer Maternal Aunt     breast   . Diabetes Paternal Grandfather   . Colon cancer Neg Hx   . Esophageal cancer Neg Hx   . Stomach cancer Neg Hx   . Rectal cancer Neg Hx     History   Social History  . Marital Status: Married    Spouse Name: N/A    Number of Children: 2   . 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 Smoking status: Former Smoker -- 12 years  . Smokeless tobacco: Never Used  . Alcohol Use: 1.0 oz/week    2 drink(s) per week     Comment: Rare  . Drug Use: No  . Sexual Activity: Yes    Partners: Male   Other Topics Concern  . Not on file   Social History Narrative  . No narrative on file   Review of Systems No nausea or vomting Bowels are fine--- mild IBS symptoms Appetite is fine No dysuria or hematuria    Objective:   Physical Exam  Constitutional: She appears well-developed and well-nourished. No distress.  Musculoskeletal:  No spine tenderness SLR is negative Mild posterior right hip tenderness Fairly normal ROM of right hip---even internal rotation  Neurological:  Normal gait and leg strength          Assessment & Plan:

## 2013-03-05 NOTE — Assessment & Plan Note (Signed)
Exam doesn't suggest severe arthritis No back symptoms ?soft tissue  Will just check x-ray Ibuprofen/heat Consider PT if persists

## 2013-03-05 NOTE — Telephone Encounter (Signed)
Pt was scheduling her CPE for August and she can't fast past 8 am because of her severe migraines. Pt was wanting to know if she could come in at 7:30 am the morning of her appointment and then come back to see Dr. Silvio Pate at the 9:30 am slot for her CPE. Please advise.

## 2013-03-05 NOTE — Patient Instructions (Signed)
Please take the ibuprofen 600mg  three times a day with meals and use the heat for the next couple of weeks. If not better, call for referral for physical therapy

## 2013-04-16 ENCOUNTER — Ambulatory Visit (INDEPENDENT_AMBULATORY_CARE_PROVIDER_SITE_OTHER): Payer: BC Managed Care – PPO | Admitting: Nurse Practitioner

## 2013-04-16 ENCOUNTER — Encounter: Payer: Self-pay | Admitting: Nurse Practitioner

## 2013-04-16 VITALS — BP 129/81 | HR 73 | Ht 63.5 in | Wt 152.8 lb

## 2013-04-16 DIAGNOSIS — F419 Anxiety disorder, unspecified: Secondary | ICD-10-CM

## 2013-04-16 DIAGNOSIS — G43909 Migraine, unspecified, not intractable, without status migrainosus: Secondary | ICD-10-CM

## 2013-04-16 DIAGNOSIS — R609 Edema, unspecified: Secondary | ICD-10-CM

## 2013-04-16 DIAGNOSIS — F411 Generalized anxiety disorder: Secondary | ICD-10-CM

## 2013-04-16 DIAGNOSIS — N959 Unspecified menopausal and perimenopausal disorder: Secondary | ICD-10-CM

## 2013-04-16 MED ORDER — SUMATRIPTAN SUCCINATE 50 MG PO TABS: 50.0000 mg | ORAL_TABLET | ORAL | Status: DC | PRN

## 2013-04-16 MED ORDER — SERTRALINE HCL 50 MG PO TABS: 50.0000 mg | ORAL_TABLET | Freq: Every day | ORAL | Status: DC

## 2013-04-16 MED ORDER — HYDROCHLOROTHIAZIDE 25 MG PO TABS
25.0000 mg | ORAL_TABLET | Freq: Every day | ORAL | Status: DC
Start: 2013-04-16 — End: 2013-08-28

## 2013-04-16 NOTE — Patient Instructions (Signed)
Migraine Headache A migraine headache is an intense, throbbing pain on one or both sides of your head. A migraine can last for 30 minutes to several hours. CAUSES  The exact cause of a migraine headache is not always known. However, a migraine may be caused when nerves in the brain become irritated and release chemicals that cause inflammation. This causes pain. Certain things may also trigger migraines, such as:  Alcohol.  Smoking.  Stress.  Menstruation.  Aged cheeses.  Foods or drinks that contain nitrates, glutamate, aspartame, or tyramine.  Lack of sleep.  Chocolate.  Caffeine.  Hunger.  Physical exertion.  Fatigue.  Medicines used to treat chest pain (nitroglycerine), birth control pills, estrogen, and some blood pressure medicines. SIGNS AND SYMPTOMS  Pain on one or both sides of your head.  Pulsating or throbbing pain.  Severe pain that prevents daily activities.  Pain that is aggravated by any physical activity.  Nausea, vomiting, or both.  Dizziness.  Pain with exposure to bright lights, loud noises, or activity.  General sensitivity to bright lights, loud noises, or smells. Before you get a migraine, you may get warning signs that a migraine is coming (aura). An aura may include:  Seeing flashing lights.  Seeing bright spots, halos, or zig-zag lines.  Having tunnel vision or blurred vision.  Having feelings of numbness or tingling.  Having trouble talking.  Having muscle weakness. DIAGNOSIS  A migraine headache is often diagnosed based on:  Symptoms.  Physical exam.  A CT scan or MRI of your head. These imaging tests cannot diagnose migraines, but they can help rule out other causes of headaches. TREATMENT Medicines may be given for pain and nausea. Medicines can also be given to help prevent recurrent migraines.  HOME CARE INSTRUCTIONS  Only take over-the-counter or prescription medicines for pain or discomfort as directed by your  health care provider. The use of long-term narcotics is not recommended.  Lie down in a dark, quiet room when you have a migraine.  Keep a journal to find out what may trigger your migraine headaches. For example, write down:  What you eat and drink.  How much sleep you get.  Any change to your diet or medicines.  Limit alcohol consumption.  Quit smoking if you smoke.  Get 7 9 hours of sleep, or as recommended by your health care provider.  Limit stress.  Keep lights dim if bright lights bother you and make your migraines worse. SEEK IMMEDIATE MEDICAL CARE IF:   Your migraine becomes severe.  You have a fever.  You have a stiff neck.  You have vision loss.  You have muscular weakness or loss of muscle control.  You start losing your balance or have trouble walking.  You feel faint or pass out.  You have severe symptoms that are different from your first symptoms. MAKE SURE YOU:   Understand these instructions.  Will watch your condition.  Will get help right away if you are not doing well or get worse. Document Released: 01/10/2005 Document Revised: 10/31/2012 Document Reviewed: 09/17/2012 [employer] Patient Information 2014 [employer].  

## 2013-04-16 NOTE — Progress Notes (Signed)
History:  [name] is a 58 y.o. G2P2 who presents to [facility]  clinic today for several issues. First statement is " I just don't feel well". This was followed by complains of irritability, weight gain, swelling in feet. She has recently weaned herself off HRT . She weaned herself off Effexor in February. She has not been exercising for at least 6 months. Some due to recent illness, some due to Achilles heel injury. She recently saw her PCP who diagnosed her with arthritis in hip.   The following portions of the patient's history were reviewed and updated as appropriate: allergies, current medications, past family history, past medical history, past social history, past surgical history and problem list.  Review of Systems:  Pertinent items are noted in HPI.  Objective:  Physical Exam BP 129/81  Pulse 73  Ht 5' 3.5" (1.613 m)  Wt 152 lb 12.8 oz (69.31 kg)  BMI 26.64 kg/m2 GENERAL: Well-developed, well-nourished female in no acute distress.  HEENT: Normocephalic, atraumatic.  NECK: Supple. Normal thyroid.  LUNGS: Normal rate. Clear to auscultation bilaterally.  HEART: Regular rate and rhythm with no adventitious sounds.  EXTREMITIES: No cyanosis, clubbing,  +1 edema, 2+ distal pulses.   Labs and Imaging No results found.  Assessment & Plan:  Assessment:  Migraines Menopause Anxiety Peripheral edema  Plans:  We had a lengthy visit and decided to start with exercise and diet. She will join gym She can start HCTZ 25 mg 1/2 tab q am for fluid. Potassium replacement advised Zoloft 50 mg daily Restart HRT Change Imitrex to 50 mg tabs Follow up 3 months   Olegario Messier, NP 04/16/2013 1:37 PM

## 2013-05-20 ENCOUNTER — Telehealth: Payer: Self-pay

## 2013-05-20 NOTE — Telephone Encounter (Signed)
Pt wanted to know last tetanus injection; tdap listed historically on 02/09/2010 on immunization list . Pt will ck to see where she had that tdap at and will cb if needed.

## 2013-06-25 ENCOUNTER — Telehealth: Payer: Self-pay | Admitting: *Deleted

## 2013-06-25 MED ORDER — ZOLPIDEM TARTRATE 10 MG PO TABS: 10.0000 mg | ORAL_TABLET | Freq: Every evening | ORAL | Status: DC | PRN

## 2013-06-25 NOTE — Telephone Encounter (Signed)
Patient called to request a refill of Ambien and to let us know that she is feeling a lot better now.

## 2013-06-25 NOTE — Telephone Encounter (Signed)
Patient is requesting a refill of Ambien 10mg .  Ok to refill per [employer].

## 2013-07-11 LAB — HM MAMMOGRAPHY: HM MAMMO: NORMAL

## 2013-07-11 LAB — HM PAP SMEAR

## 2013-08-28 ENCOUNTER — Encounter: Payer: Self-pay | Admitting: Internal Medicine

## 2013-08-28 ENCOUNTER — Ambulatory Visit (INDEPENDENT_AMBULATORY_CARE_PROVIDER_SITE_OTHER): Payer: BC Managed Care – PPO | Admitting: Internal Medicine

## 2013-08-28 VITALS — BP 110/80 | HR 76 | Temp 97.5°F | Ht 63.5 in | Wt 151.0 lb

## 2013-08-28 DIAGNOSIS — Z0000 Encounter for general adult medical examination without abnormal findings: Secondary | ICD-10-CM

## 2013-08-28 DIAGNOSIS — G479 Sleep disorder, unspecified: Secondary | ICD-10-CM

## 2013-08-28 DIAGNOSIS — N951 Menopausal and female climacteric states: Secondary | ICD-10-CM

## 2013-08-28 DIAGNOSIS — R609 Edema, unspecified: Secondary | ICD-10-CM | POA: Insufficient documentation

## 2013-08-28 LAB — COMPREHENSIVE METABOLIC PANEL
ALK PHOS: 91 U/L (ref 39–117)
ALT: 12 U/L (ref 0–35)
AST: 25 U/L (ref 0–37)
Albumin: 4 g/dL (ref 3.5–5.2)
BUN: 10 mg/dL (ref 6–23)
CO2: 30 mEq/L (ref 19–32)
CREATININE: 0.7 mg/dL (ref 0.4–1.2)
Calcium: 9.1 mg/dL (ref 8.4–10.5)
Chloride: 98 mEq/L (ref 96–112)
GFR: 95.26 mL/min (ref >60.00)
Glucose, Bld: 95 mg/dL (ref 70–99)
Potassium: 3.7 mEq/L (ref 3.5–5.1)
Sodium: 135 mEq/L (ref 135–145)
Total Bilirubin: 0.7 mg/dL (ref 0.2–1.2)
Total Protein: 7.2 g/dL (ref 6.0–8.3)

## 2013-08-28 LAB — CBC WITH DIFFERENTIAL/PLATELET
BASOS ABS: 0 10*3/uL (ref 0.0–0.1)
Basophils Relative: 0.6 % (ref 0.0–3.0)
Eosinophils Absolute: 0.2 10*3/uL (ref 0.0–0.7)
Eosinophils Relative: 2.3 % (ref 0.0–5.0)
HEMATOCRIT: 40 % (ref 36.0–46.0)
Hemoglobin: 13.3 g/dL (ref 12.0–15.0)
LYMPHS ABS: 2.3 10*3/uL (ref 0.7–4.0)
Lymphocytes Relative: 31.8 % (ref 12.0–46.0)
MCHC: 33.3 g/dL (ref 30.0–36.0)
MCV: 87.5 fl (ref 78.0–100.0)
MONOS PCT: 7.7 % (ref 3.0–12.0)
Monocytes Absolute: 0.6 10*3/uL (ref 0.1–1.0)
Neutro Abs: 4.2 10*3/uL (ref 1.4–7.7)
Neutrophils Relative %: 57.6 % (ref 43.0–77.0)
PLATELETS: 370 10*3/uL (ref 150.0–400.0)
RBC: 4.57 Mil/uL (ref 3.87–5.11)
RDW: 13.7 % (ref 11.5–15.5)
WBC: 7.2 10*3/uL (ref 4.0–10.5)

## 2013-08-28 LAB — LIPID PANEL
Cholesterol: 187 mg/dL (ref 0–200)
HDL: 65.8 mg/dL (ref >39.00)
LDL Cholesterol: 83 mg/dL (ref 0–99)
NONHDL: 121.2
Total CHOL/HDL Ratio: 3
Triglycerides: 190 mg/dL — ABNORMAL HIGH (ref 0.0–149.0)
VLDL: 38 mg/dL (ref 0.0–40.0)

## 2013-08-28 LAB — T4, FREE: Free T4: 0.85 ng/dL (ref 0.60–1.60)

## 2013-08-28 NOTE — Progress Notes (Signed)
Pre visit review using our clinic review tool, if applicable. No additional management support is needed unless otherwise documented below in the visit note. 

## 2013-08-28 NOTE — Assessment & Plan Note (Signed)
Lifelong Uses the zolpidem nightly

## 2013-08-28 NOTE — Assessment & Plan Note (Signed)
Prone to salt retention Uses the diuretic ~every other day

## 2013-08-28 NOTE — Assessment & Plan Note (Signed)
Can't do without the estrogen Sertraline helps this and as migraine prophylaxis

## 2013-08-28 NOTE — Progress Notes (Signed)
Subjective:    Patient ID: [name], female    DOB: July 05, 1955, 58 y.o.   MRN: [id number]  HPI Here for physical Recent gyn visit Back and hip pain are better--doing PT and now home exercise program Continues on estrogen and sertraline for menopausal symptoms Some degree of anxiety as well  Uses the diuretic for edema Needs it 3-4 times per week Tries to be careful with salt in diet  Uses the zolpidem every day 1/3-1/2 tab daily Doesn't sleep at all if she skips a day----chronic sleep problem just like mom  Current Outpatient Prescriptions on File Prior to Visit  Medication Sig Dispense Refill  . estradiol (ESTRACE) 0.5 MG tablet Take 0.5 mg by mouth daily.        . medroxyPROGESTERone (PROVERA) 5 MG tablet Take 1/2 tablet by mouth 12 days a month      . SUMAtriptan (IMITREX) 50 MG tablet Take 1 tablet (50 mg total) by mouth every 2 (two) hours as needed for migraine or headache. May repeat in 2 hours if headache persists or recurs.  18 tablet  11   No current facility-administered medications on file prior to visit.    No Known Allergies  Past Medical History  Diagnosis Date  . Migraines   . IBS (irritable bowel syndrome)   . Osteoarthritis   . AR (allergic rhinitis)   . Osteopenia     Past Surgical History  Procedure Laterality Date  . Tonsillectomy  12/76  . Cholecystectomy  4/85  . Vaginal delivery      x 2  . Tubal ligation  1990  . Dilation and curettage of uterus  10/05  . Carpal tunnel release  3/12    right. Left done 2010. Dr Fredna Dow and left side 2012  . Colonoscopy      Family History  Problem Relation Age of Onset  . Macular degeneration Mother   . Hypertension Father   . Diabetes Father   . Hypertension Sister   . Cerebral aneurysm Sister   . Cancer Maternal Aunt     breast   . Diabetes Paternal Grandfather   . Colon cancer Neg Hx   . Esophageal cancer Neg Hx   . Stomach cancer Neg Hx   . Rectal cancer Neg Hx     History    Social History  . Marital Status: Married    Spouse Name: N/A    Number of Children: 2  . 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 Smoking status: Former Smoker -- 12 years  . Smokeless tobacco: Never Used  . Alcohol Use: 1.0 oz/week    2 drink(s) per week     Comment: Rare  . Drug Use: No  . Sexual Activity: Yes    Partners: Male   Other Topics Concern  . Not on file   Social History Narrative  . No narrative on file   Review of Systems  Constitutional: Negative for fatigue and unexpected weight change.       Wears seat belt Weight down slightly-works on fitness  HENT: Positive for dental problem. Negative for hearing loss and tinnitus.        Broken tooth--getting crown today  Eyes: Negative for visual disturbance.       No diplopia or unilateral vision loss  Respiratory: Negative for cough, chest tightness and shortness of breath.  Cardiovascular: Positive for chest pain and leg swelling. Negative for palpitations.       Rare indigestion feeling--?stress with dad in hospital, etc No change in exercise tolerance  Gastrointestinal: Positive for diarrhea. Negative for nausea, vomiting, abdominal pain and blood in stool.       Will get some urgency and loose stools No heartburn or swallowing problems  Endocrine: Negative for cold intolerance and heat intolerance.  Genitourinary: Negative for dysuria, hematuria and dyspareunia.       Early stress incontinence  Musculoskeletal: Positive for arthralgias and back pain. Negative for joint swelling.       Back and hip is better---piriformis  Skin: Negative for rash.       No suspicious lesions---yearly derm appt  Allergic/Immunologic: Positive for environmental allergies. Negative for immunocompromised state.       Rarely uses loratadine  Neurological: Positive for headaches. Negative for dizziness, syncope, weakness, light-headedness and numbness.        Imitrex helps for headaches  Hematological: Negative for adenopathy. Bruises/bleeds easily.  Psychiatric/Behavioral: Positive for sleep disturbance. Negative for dysphoric mood. The patient is nervous/anxious.        Family stressors cause anxiety       Objective:   Physical Exam  Constitutional: She is oriented to person, place, and time. She appears well-developed and well-nourished. No distress.  HENT:  Head: Normocephalic and atraumatic.  Right Ear: External ear normal.  Left Ear: External ear normal.  Mouth/Throat: Oropharynx is clear and moist. No oropharyngeal exudate.  Eyes: Conjunctivae and EOM are normal. Pupils are equal, round, and reactive to light.  Neck: Normal range of motion. Neck supple. No thyromegaly present.  Cardiovascular: Normal rate, regular rhythm, normal heart sounds and intact distal pulses.  Exam reveals no gallop.   No murmur heard. Pulmonary/Chest: Effort normal and breath sounds normal. No respiratory distress. She has no wheezes. She has no rales.  Abdominal: Soft. There is no tenderness.  Musculoskeletal: She exhibits no edema and no tenderness.  Lymphadenopathy:    She has no cervical adenopathy.  Neurological: She is alert and oriented to person, place, and time.  Skin: No rash noted. No erythema.  Psychiatric: She has a normal mood and affect. Her behavior is normal.          Assessment & Plan:

## 2013-08-28 NOTE — Assessment & Plan Note (Signed)
Healthy Discussed fitness---needs plan for the winter UTD with cancer screening and imms

## 2013-11-25 ENCOUNTER — Encounter: Payer: Self-pay | Admitting: Internal Medicine

## 2013-11-26 ENCOUNTER — Other Ambulatory Visit: Payer: Self-pay | Admitting: *Deleted

## 2013-11-26 MED ORDER — SUMATRIPTAN SUCCINATE 50 MG PO TABS: ORAL_TABLET | ORAL | Status: DC

## 2013-11-26 NOTE — Telephone Encounter (Signed)
Needed to authorize refills, patient was given 11 refills on last visit but they were not recorded by the pharmacy so she needs additional refills authorized.

## 2013-12-05 ENCOUNTER — Other Ambulatory Visit: Payer: Self-pay | Admitting: *Deleted

## 2013-12-05 MED ORDER — SUMATRIPTAN SUCCINATE 50 MG PO TABS: ORAL_TABLET | ORAL | Status: DC

## 2013-12-05 NOTE — Telephone Encounter (Signed)
Received a request from the pharmacy for a refill of patients imitrex.  I authorized refills.

## 2014-04-08 ENCOUNTER — Ambulatory Visit (INDEPENDENT_AMBULATORY_CARE_PROVIDER_SITE_OTHER): Payer: BC Managed Care – PPO | Admitting: Physician Assistant

## 2014-04-08 ENCOUNTER — Encounter: Payer: Self-pay | Admitting: Physician Assistant

## 2014-04-08 VITALS — BP 115/64 | HR 63 | Wt 156.4 lb

## 2014-04-08 DIAGNOSIS — G43709 Chronic migraine without aura, not intractable, without status migrainosus: Secondary | ICD-10-CM | POA: Insufficient documentation

## 2014-04-08 DIAGNOSIS — G43009 Migraine without aura, not intractable, without status migrainosus: Secondary | ICD-10-CM | POA: Diagnosis not present

## 2014-04-08 MED ORDER — SERTRALINE HCL 50 MG PO TABS: 25.0000 mg | ORAL_TABLET | Freq: Every day | ORAL | Status: DC

## 2014-04-08 MED ORDER — SUMATRIPTAN SUCCINATE 100 MG PO TABS: ORAL_TABLET | ORAL | Status: DC

## 2014-04-08 MED ORDER — ZOLPIDEM TARTRATE 10 MG PO TABS: 5.0000 mg | ORAL_TABLET | Freq: Every evening | ORAL | Status: DC | PRN

## 2014-04-08 NOTE — Patient Instructions (Signed)

## 2014-04-08 NOTE — Progress Notes (Signed)
Patient ID: [name], female   DOB: 06-Sep-1955, 59 y.o.   MRN: [id number] History:  [name] is a 59 y.o. G2P2 who presents to clinic today for follow up of migraines.   HA's are mod to severe and lasting a full day if left untreated.   They are located mostly in he left temple although occasionally right parietal region.  They are worsened with movement.  She has associated photo and phonophobia.  There is no nausea or vomiting.   Imitrex is effective, used early.  Occasionally needs second dose.  Triggered by barometric change, red wine, not eating.  Zoloft has been working well for HA prevention.  No recent changes.    HIT 6 score of 54.  She denies chest pain, SOB, numbness, tingling, weakness, fever.   The following portions of the patient's history were reviewed and updated as appropriate: allergies, current medications, past family history, past medical history, past social history, past surgical history and problem list.  Review of Systems:  Pertinent items are noted in HPI.  Objective:  Physical Exam BP:115/64 Pulse: 63 Weight 156.4 GENERAL: Well-developed, well-nourished female in no acute distress.  HEENT: Normocephalic, atraumatic.  NECK: Supple. Mod tension palpated bilat cervical paraspinal muscles.    LUNGS: Normal rate. Clear to auscultation bilaterally.  HEART: Regular rate and rhythm with no adventitious sounds.   Labs and Imaging No results found.  Assessment & Plan:  Assessment: 1. Migraine without aura and without status migrainosus, not intractable    Plans: Medications refilled without change Trigger avoidance discussed Good sleep habits discussed RTC 1 year unless need arises  Paticia Stack, PA-C 04/08/2014 10:13 AM

## 2014-07-09 ENCOUNTER — Encounter: Payer: Self-pay | Admitting: Gastroenterology

## 2014-07-23 ENCOUNTER — Other Ambulatory Visit: Payer: Self-pay | Admitting: Obstetrics and Gynecology

## 2014-07-23 DIAGNOSIS — R928 Other abnormal and inconclusive findings on diagnostic imaging of breast: Secondary | ICD-10-CM

## 2014-08-04 ENCOUNTER — Other Ambulatory Visit: Payer: Self-pay | Admitting: Obstetrics and Gynecology

## 2014-08-04 ENCOUNTER — Ambulatory Visit
Admission: RE | Admit: 2014-08-04 | Discharge: 2014-08-04 | Disposition: A | Discharge status: Home / Self Care | Payer: BC Managed Care – PPO | Source: Ambulatory Visit | Attending: Obstetrics and Gynecology | Admitting: Obstetrics and Gynecology

## 2014-08-04 DIAGNOSIS — R928 Other abnormal and inconclusive findings on diagnostic imaging of breast: Secondary | ICD-10-CM

## 2014-08-14 ENCOUNTER — Ambulatory Visit
Admission: RE | Admit: 2014-08-14 | Discharge: 2014-08-14 | Disposition: A | Discharge status: Home / Self Care | Payer: BC Managed Care – PPO | Source: Ambulatory Visit | Attending: Obstetrics and Gynecology | Admitting: Obstetrics and Gynecology

## 2014-08-14 ENCOUNTER — Other Ambulatory Visit: Payer: Self-pay | Admitting: Obstetrics and Gynecology

## 2014-08-14 DIAGNOSIS — R928 Other abnormal and inconclusive findings on diagnostic imaging of breast: Secondary | ICD-10-CM

## 2014-09-01 ENCOUNTER — Telehealth: Payer: Self-pay | Admitting: Internal Medicine

## 2014-09-01 ENCOUNTER — Encounter: Payer: BC Managed Care – PPO | Admitting: Internal Medicine

## 2014-09-01 NOTE — Telephone Encounter (Signed)
Patient did not come in for their appointment today for cpe.  Please let me know if patient needs to be contacted immediately for follow up or no follow up needed. °

## 2014-09-01 NOTE — Telephone Encounter (Signed)
L/m for pt to return call, need to rs appt

## 2014-09-01 NOTE — Telephone Encounter (Signed)
She needs to reschedule her PE at her convenience

## 2014-09-01 NOTE — Telephone Encounter (Signed)
Pt r/s for 8/24. Pt aware

## 2014-09-17 ENCOUNTER — Ambulatory Visit (INDEPENDENT_AMBULATORY_CARE_PROVIDER_SITE_OTHER): Payer: BC Managed Care – PPO | Admitting: Internal Medicine

## 2014-09-17 ENCOUNTER — Encounter: Payer: Self-pay | Admitting: Internal Medicine

## 2014-09-17 VITALS — BP 120/70 | HR 81 | Temp 97.9°F | Ht 63.25 in | Wt 156.0 lb

## 2014-09-17 DIAGNOSIS — Z23 Encounter for immunization: Secondary | ICD-10-CM | POA: Diagnosis not present

## 2014-09-17 DIAGNOSIS — Z0000 Encounter for general adult medical examination without abnormal findings: Secondary | ICD-10-CM | POA: Diagnosis not present

## 2014-09-17 NOTE — Assessment & Plan Note (Signed)
Up to date with tetanus Colon due 2018 Recent mammogram--false alarm with negative biopsy Recent pap and cervical polyp Will give flu shot Discussed lifestyle Will defer any blood work

## 2014-09-17 NOTE — Progress Notes (Signed)
Pre visit review using our clinic review tool, if applicable. No additional management support is needed unless otherwise documented below in the visit note. 

## 2014-09-17 NOTE — Addendum Note (Signed)
Addended by: Despina Hidden on: 09/17/2014 11:35 AM   Modules accepted: Orders

## 2014-09-17 NOTE — Progress Notes (Signed)
Subjective:    Patient ID: [name], female    DOB: 07-Feb-1955, 59 y.o.   MRN: [id number]  HPI Here for physical  No new concerns Not exercising much ---back and forth with ill dad in [place] Recent breast biopsy--negative Had cervical polyp removed recently--- was benign  Current Outpatient Prescriptions on File Prior to Visit  Medication Sig Dispense Refill  . sertraline (ZOLOFT) 50 MG tablet Take 0.5-1 tablets (25-50 mg total) by mouth daily. 30 tablet 11  . SUMAtriptan (IMITREX) 100 MG tablet Take one tablet at onset of migraine may repeat in two hours if needed. 9 tablet 11  . zolpidem (AMBIEN) 10 MG tablet Take 0.5 tablets (5 mg total) by mouth at bedtime as needed for sleep. 30 tablet 3   No current facility-administered medications on file prior to visit.    No Known Allergies  Past Medical History  Diagnosis Date  . Migraines   . IBS (irritable bowel syndrome)   . Osteoarthritis   . AR (allergic rhinitis)   . Osteopenia     Past Surgical History  Procedure Laterality Date  . Tonsillectomy  12/76  . Cholecystectomy  4/85  . Vaginal delivery      x 2  . Tubal ligation  1990  . Dilation and curettage of uterus  10/05  . Carpal tunnel release  3/12    right. Left done 2010. Dr Fredna Dow and left side 2012  . Colonoscopy      Family History  Problem Relation Age of Onset  . Macular degeneration Mother   . Hypertension Father   . Diabetes Father   . Hypertension Sister   . Cerebral aneurysm Sister   . Cancer Maternal Aunt     breast   . Diabetes Paternal Grandfather   . Colon cancer Neg Hx   . Esophageal cancer Neg Hx   . Stomach cancer Neg Hx   . Rectal cancer Neg Hx     Social History   Social History  . Marital Status: Married    Spouse Name: N/A  . Number of Children: 2  . 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 Smoking status: Former Smoker -- 12 years  .  Smokeless tobacco: Never Used  . Alcohol Use: 1.0 oz/week    2 drink(s) per week     Comment: Rare  . Drug Use: No  . Sexual Activity:    Partners: Male   Other Topics Concern  . Not on file   Social History Narrative    Review of Systems  Constitutional: Negative for fatigue and unexpected weight change.       Wears seat belt  HENT: Negative for dental problem, hearing loss and tinnitus.        Regular with dentist  Eyes: Negative for visual disturbance.       No diplopia or unilateral vision loss  Respiratory: Negative for cough, chest tightness and shortness of breath.   Cardiovascular: Positive for leg swelling. Negative for chest pain and palpitations.  Gastrointestinal: Negative for nausea, vomiting, abdominal pain, constipation and blood in stool.       Same IBS---loose stools every 3 days or so  Endocrine: Negative for polydipsia and polyuria.       Checks her sugar at home at times---fine  Genitourinary: Negative for dysuria, hematuria and dyspareunia.       Prempro  now instead of 2 different prescriptions---less effective  Musculoskeletal: Negative for back pain, joint swelling and arthralgias.  Skin: Negative for rash.       Yearly derm visit  Allergic/Immunologic: Positive for environmental allergies. Negative for immunocompromised state.  Neurological: Positive for headaches. Negative for dizziness, syncope, weakness, light-headedness and numbness.  Hematological: Negative for adenopathy. Bruises/bleeds easily.  Psychiatric/Behavioral: Positive for sleep disturbance. Negative for dysphoric mood. The patient is not nervous/anxious.        Still uses the zolpidem or doesn't sleep       Objective:   Physical Exam  Constitutional: She is oriented to person, place, and time. She appears well-developed and well-nourished. No distress.  HENT:  Head: Normocephalic and atraumatic.  Right Ear: External ear normal.  Left Ear: External ear normal.  Mouth/Throat:  Oropharynx is clear and moist. No oropharyngeal exudate.  Eyes: Conjunctivae and EOM are normal. Pupils are equal, round, and reactive to light.  Neck: Normal range of motion. Neck supple. No thyromegaly present.  Cardiovascular: Normal rate, regular rhythm, normal heart sounds and intact distal pulses.  Exam reveals no gallop.   No murmur heard. Pulmonary/Chest: Effort normal and breath sounds normal. No respiratory distress. She has no wheezes. She has no rales.  Abdominal: Soft. There is no tenderness.  Musculoskeletal: She exhibits no edema or tenderness.  Lymphadenopathy:    She has no cervical adenopathy.  Neurological: She is alert and oriented to person, place, and time.  Skin: No rash noted. No erythema.  Psychiatric: She has a normal mood and affect. Her behavior is normal.          Assessment & Plan:

## 2014-10-09 ENCOUNTER — Telehealth: Payer: Self-pay | Admitting: *Deleted

## 2014-10-09 NOTE — Telephone Encounter (Signed)
Received a request from [facility] for refill on Ambien. Please advise.

## 2014-10-15 ENCOUNTER — Encounter: Payer: Self-pay | Admitting: *Deleted

## 2015-01-20 ENCOUNTER — Encounter: Payer: Self-pay | Admitting: Primary Care

## 2015-01-20 ENCOUNTER — Telehealth: Payer: Self-pay

## 2015-01-20 ENCOUNTER — Ambulatory Visit (INDEPENDENT_AMBULATORY_CARE_PROVIDER_SITE_OTHER): Payer: BC Managed Care – PPO | Admitting: Primary Care

## 2015-01-20 VITALS — BP 100/70 | HR 94 | Temp 98.0°F | Wt 155.0 lb

## 2015-01-20 DIAGNOSIS — J329 Chronic sinusitis, unspecified: Secondary | ICD-10-CM | POA: Diagnosis not present

## 2015-01-20 MED ORDER — AMOXICILLIN-POT CLAVULANATE 875-125 MG PO TABS: 1.0000 | ORAL_TABLET | Freq: Two times a day (BID) | ORAL | Status: DC

## 2015-01-20 NOTE — Progress Notes (Signed)
Pre visit review using our clinic review tool, if applicable. No additional management support is needed unless otherwise documented below in the visit note. 

## 2015-01-20 NOTE — Telephone Encounter (Signed)
Pt has appt 01/20/15 at 4 pm with Allie Bossier, NP.

## 2015-01-20 NOTE — Progress Notes (Signed)
Subjective:    Patient ID: [name], female    DOB: 02/24/1955, 59 y.o.   MRN: [id number]  HPI  [name] is a 59 year old female who presents today with a chief complaint of sinus pressure. She also reports nasal congestion, rhinorrhea, cough. Her symptoms began 2 weeks ago. Since Sunday this week she's had increased sinus pressure and is blowing green mucous from her nasal cavity. She's been taking Dayquil, flonase, vitamin C, saline nasal spray with temporary improvement. Denies fevers, sore throat, chills.   Review of Systems  Constitutional: Positive for fatigue. Negative for fever and chills.  HENT: Positive for congestion, rhinorrhea and sinus pressure. Negative for ear pain and sore throat.   Respiratory: Positive for cough. Negative for shortness of breath.        Past Medical History  Diagnosis Date  . Migraines   . IBS (irritable bowel syndrome)   . Osteoarthritis   . AR (allergic rhinitis)   . Osteopenia     Social History   Social History  . Marital Status: Married    Spouse Name: N/A  . Number of Children: 2  . 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 Smoking status: Former Smoker -- 12 years  . Smokeless tobacco: Never Used  . Alcohol Use: 1.0 oz/week    2 drink(s) per week     Comment: Rare  . Drug Use: No  . Sexual Activity:    Partners: Male   Other Topics Concern  . Not on file   Social History Narrative    Past Surgical History  Procedure Laterality Date  . Tonsillectomy  12/76  . Cholecystectomy  4/85  . Vaginal delivery      x 2  . Tubal ligation  1990  . Dilation and curettage of uterus  10/05  . Carpal tunnel release  3/12    right. Left done 2010. Dr Fredna Dow and left side 2012  . Colonoscopy      Family History  Problem Relation Age of Onset  . Macular degeneration Mother   . Hypertension Father   . Diabetes Father   . Hypertension Sister     . Cerebral aneurysm Sister   . Cancer Maternal Aunt     breast   . Diabetes Paternal Grandfather   . Colon cancer Neg Hx   . Esophageal cancer Neg Hx   . Stomach cancer Neg Hx   . Rectal cancer Neg Hx     No Known Allergies  Current Outpatient Prescriptions on File Prior to Visit  Medication Sig Dispense Refill  . PREMPRO 0.3-1.5 MG per tablet Take 1 tablet by mouth daily.   1  . sertraline (ZOLOFT) 50 MG tablet Take 0.5-1 tablets (25-50 mg total) by mouth daily. 30 tablet 11  . SUMAtriptan (IMITREX) 100 MG tablet Take one tablet at onset of migraine may repeat in two hours if needed. 9 tablet 11  . zolpidem (AMBIEN) 10 MG tablet Take 0.5 tablets (5 mg total) by mouth at bedtime as needed for sleep. 30 tablet 3   No current facility-administered medications on file prior to visit.    BP 100/70 mmHg  Pulse 94  Temp(Src) 98 F (36.7 C) (Tympanic)  Wt 155 lb (70.308 kg)  SpO2 98%    Objective:   Physical Exam  Constitutional: She appears well-nourished.  HENT:  Right Ear:  Tympanic membrane and ear canal normal.  Left Ear: Tympanic membrane and ear canal normal.  Nose: Right sinus exhibits maxillary sinus tenderness and frontal sinus tenderness. Left sinus exhibits maxillary sinus tenderness and frontal sinus tenderness.  Mouth/Throat: Oropharynx is clear and moist.  Eyes: Conjunctivae are normal. Pupils are equal, round, and reactive to light.  Neck: Neck supple.  Cardiovascular: Normal rate and regular rhythm.   Pulmonary/Chest: Effort normal and breath sounds normal.  Lymphadenopathy:    She has no cervical adenopathy.  Skin: Skin is warm and dry.          Assessment & Plan:  Sinusitis:  Sinus pressure, cough, fatigue x 2 weeks. Symptoms worse this past Sunday.  Currently with green mucous from nasal cavity and swelling to right maxillary sinus.  Tenderness to right maxillary sinus. Lungs clear. Due to exam and duration of symptoms, will treat with  antibiotics.  Start Augmentin course. Continue flonase, fluids, rest. Follow up PRN.

## 2015-01-20 NOTE — Telephone Encounter (Signed)
PLEASE NOTE: All timestamps contained within this report are represented as [place] Standard Time. CONFIDENTIALTY NOTICE: This fax transmission is intended only for the addressee. It contains information that is legally privileged, confidential or otherwise protected from use or disclosure. If you are not the intended recipient, you are strictly prohibited from reviewing, disclosing, copying using or disseminating any of this information or taking any action in reliance on or regarding this information. If you have received this fax in error, please notify us immediately by telephone so that we can arrange for its return to [place]. Phone: [phone], Toll-Free: [phone], Fax: [phone] Page: 1 of 1 Call Id: [id number] [place] Patient Name: [name] Gender: Female DOB: 12-Dec-1955 Age: 59 Y 11 D Return Phone Number: [phone] (Primary) Address: City/State/Zip: [place] Client [place] Night - Client Client Site [place] Physician Viviana Simpler Contact Type Call Call Type Triage / Clinical Relationship To Patient Self Return Phone Number [phone] (Primary) Chief Complaint Nasal Congestion Initial Comment caller states she has a sinus infection - would like to get something for it Nurse Assessment Guidelines Guideline Title Affirmed Question Affirmed Notes Nurse Date/Time (Eastern Time) Disp. Time Eilene Ghazi Time) Disposition Final User 01/19/2015 5:14:34 PM Send To Extended Follow Up [place], Lurline Hare 01/20/2015 10:15:25 AM Clinical Call Yes Alinda Money After Care Instructions Given Call Event Type User Date / Time Description Comments User: Alinda Money Date/Time Eilene Ghazi Time): 01/20/2015 10:15:15 AM Pt states still needs to speak with the office. Xfer to office for help.

## 2015-01-20 NOTE — Patient Instructions (Signed)
Start Augmentin antibiotics. Take 1 tablet by mouth twice daily for 10 days. Continue Flonase nasal spray.   Increase consumption of fluids and rest.  It was a pleasure meeting you!   Sinusitis, Adult Sinusitis is redness, soreness, and inflammation of the paranasal sinuses. Paranasal sinuses are air pockets within the bones of your face. They are located beneath your eyes, in the middle of your forehead, and above your eyes. In healthy paranasal sinuses, mucus is able to drain out, and air is able to circulate through them by way of your nose. However, when your paranasal sinuses are inflamed, mucus and air can become trapped. This can allow bacteria and other germs to grow and cause infection. Sinusitis can develop quickly and last only a short time (acute) or continue over a long period (chronic). Sinusitis that lasts for more than 12 weeks is considered chronic. CAUSES Causes of sinusitis include:  Allergies.  Structural abnormalities, such as displacement of the cartilage that separates your nostrils (deviated septum), which can decrease the air flow through your nose and sinuses and affect sinus drainage.  Functional abnormalities, such as when the small hairs (cilia) that line your sinuses and help remove mucus do not work properly or are not present. SIGNS AND SYMPTOMS Symptoms of acute and chronic sinusitis are the same. The primary symptoms are pain and pressure around the affected sinuses. Other symptoms include:  Upper toothache.  Earache.  Headache.  Bad breath.  Decreased sense of smell and taste.  A cough, which worsens when you are lying flat.  Fatigue.  Fever.  Thick drainage from your nose, which often is green and may contain pus (purulent).  Swelling and warmth over the affected sinuses. DIAGNOSIS Your health care provider will perform a physical exam. During your exam, your health care provider may perform any of the following to help determine if you  have acute sinusitis or chronic sinusitis:  Look in your nose for signs of abnormal growths in your nostrils (nasal polyps).  Tap over the affected sinus to check for signs of infection.  View the inside of your sinuses using an imaging device that has a light attached (endoscope). If your health care provider suspects that you have chronic sinusitis, one or more of the following tests may be recommended:  Allergy tests.  Nasal culture. A sample of mucus is taken from your nose, sent to a lab, and screened for bacteria.  Nasal cytology. A sample of mucus is taken from your nose and examined by your health care provider to determine if your sinusitis is related to an allergy. TREATMENT Most cases of acute sinusitis are related to a viral infection and will resolve on their own within 10 days. Sometimes, medicines are prescribed to help relieve symptoms of both acute and chronic sinusitis. These may include pain medicines, decongestants, nasal steroid sprays, or saline sprays. However, for sinusitis related to a bacterial infection, your health care provider will prescribe antibiotic medicines. These are medicines that will help kill the bacteria causing the infection. Rarely, sinusitis is caused by a fungal infection. In these cases, your health care provider will prescribe antifungal medicine. For some cases of chronic sinusitis, surgery is needed. Generally, these are cases in which sinusitis recurs more than 3 times per year, despite other treatments. HOME CARE INSTRUCTIONS  Drink plenty of water. Water helps thin the mucus so your sinuses can drain more easily.  Use a humidifier.  Inhale steam 3-4 times a day (for example, sit in  the bathroom with the shower running).  Apply a warm, moist washcloth to your face 3-4 times a day, or as directed by your health care provider.  Use saline nasal sprays to help moisten and clean your sinuses.  Take medicines only as directed by your health  care provider.  If you were prescribed either an antibiotic or antifungal medicine, finish it all even if you start to feel better. SEEK IMMEDIATE MEDICAL CARE IF:  You have increasing pain or severe headaches.  You have nausea, vomiting, or drowsiness.  You have swelling around your face.  You have vision problems.  You have a stiff neck.  You have difficulty breathing.   This information is not intended to replace advice given to you by your health care provider. Make sure you discuss any questions you have with your health care provider.   Document Released: 01/10/2005 Document Revised: 01/31/2014 Document Reviewed: 01/25/2011 Elsevier Interactive Patient Education [employer].

## 2015-02-26 ENCOUNTER — Other Ambulatory Visit: Payer: Self-pay | Admitting: Obstetrics and Gynecology

## 2015-02-26 DIAGNOSIS — N6320 Unspecified lump in the left breast, unspecified quadrant: Secondary | ICD-10-CM

## 2015-03-10 ENCOUNTER — Ambulatory Visit
Admission: RE | Admit: 2015-03-10 | Discharge: 2015-03-10 | Disposition: A | Discharge status: Home / Self Care | Payer: BC Managed Care – PPO | Source: Ambulatory Visit | Attending: Obstetrics and Gynecology | Admitting: Obstetrics and Gynecology

## 2015-03-10 DIAGNOSIS — N6320 Unspecified lump in the left breast, unspecified quadrant: Secondary | ICD-10-CM

## 2015-04-02 ENCOUNTER — Other Ambulatory Visit: Payer: Self-pay

## 2015-04-02 DIAGNOSIS — Z1231 Encounter for screening mammogram for malignant neoplasm of breast: Secondary | ICD-10-CM

## 2015-04-03 ENCOUNTER — Other Ambulatory Visit: Payer: Self-pay | Admitting: Physician Assistant

## 2015-04-03 DIAGNOSIS — G43709 Chronic migraine without aura, not intractable, without status migrainosus: Secondary | ICD-10-CM

## 2015-04-12 ENCOUNTER — Other Ambulatory Visit: Payer: Self-pay | Admitting: Physician Assistant

## 2015-04-12 DIAGNOSIS — G43709 Chronic migraine without aura, not intractable, without status migrainosus: Secondary | ICD-10-CM

## 2015-04-13 MED ORDER — SERTRALINE HCL 50 MG PO TABS: 25.0000 mg | ORAL_TABLET | Freq: Every day | ORAL | Status: DC

## 2015-04-13 NOTE — Telephone Encounter (Signed)
Pt has follow-up appt 04-24-15, received rx refill request for medications from [facility], sent one refill to pharmacy until pt could come in for follow-up appt.

## 2015-04-24 ENCOUNTER — Ambulatory Visit (INDEPENDENT_AMBULATORY_CARE_PROVIDER_SITE_OTHER): Payer: BC Managed Care – PPO | Admitting: Physician Assistant

## 2015-04-24 ENCOUNTER — Encounter: Payer: Self-pay | Admitting: Physician Assistant

## 2015-04-24 VITALS — BP 100/67 | HR 91 | Ht 63.5 in | Wt 161.0 lb

## 2015-04-24 DIAGNOSIS — N951 Menopausal and female climacteric states: Secondary | ICD-10-CM

## 2015-04-24 DIAGNOSIS — G43709 Chronic migraine without aura, not intractable, without status migrainosus: Secondary | ICD-10-CM

## 2015-04-24 DIAGNOSIS — M62838 Other muscle spasm: Secondary | ICD-10-CM

## 2015-04-24 DIAGNOSIS — G479 Sleep disorder, unspecified: Secondary | ICD-10-CM

## 2015-04-24 MED ORDER — ZOLPIDEM TARTRATE 10 MG PO TABS: 5.0000 mg | ORAL_TABLET | Freq: Every evening | ORAL | Status: DC | PRN

## 2015-04-24 MED ORDER — SUMATRIPTAN SUCCINATE 100 MG PO TABS: 100.0000 mg | ORAL_TABLET | Freq: Once | ORAL | Status: DC

## 2015-04-24 MED ORDER — SERTRALINE HCL 50 MG PO TABS: 50.0000 mg | ORAL_TABLET | Freq: Two times a day (BID) | ORAL | Status: DC

## 2015-04-24 NOTE — Patient Instructions (Signed)

## 2015-04-24 NOTE — Progress Notes (Signed)
Patient ID: [name], female   DOB: 1955/01/31, 60 y.o.   MRN: [id number] History:  [name] is a 60 y.o. G2P2 who presents to clinic today for yearly follow up of migraine.  They are nearly always related to barometric changes.  She takes Imitrex at first sign of migraine and this helps with her.  She feels she continues to have hormonal influences with her mood, HA's and other symptoms.  She sometimes feels extra anxious or up-tight.  This may be associated with HA.  Her medications otherwise continue to work well for her.   She is overall in good health.  She is able to exercise routinely.    HIT6:60 Number of days in the last 4 weeks with:  Severe headache: 0 Moderate headache:9 Mild headache: 0  No headache: 19  Past Medical History  Diagnosis Date  . Migraines   . IBS (irritable bowel syndrome)   . Osteoarthritis   . AR (allergic rhinitis)   . Osteopenia     Social History   Social History  . Marital Status: Married    Spouse Name: N/A  . Number of Children: 2  . 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 Smoking status: Former Smoker -- 12 years  . Smokeless tobacco: Never Used  . Alcohol Use: 1.0 oz/week    2 drink(s) per week     Comment: Rare  . Drug Use: No  . Sexual Activity:    Partners: Male   Other Topics Concern  . Not on file   Social History Narrative    Family History  Problem Relation Age of Onset  . Macular degeneration Mother   . Hypertension Father   . Diabetes Father   . Hypertension Sister   . Cerebral aneurysm Sister   . Cancer Maternal Aunt     breast   . Diabetes Paternal Grandfather   . Colon cancer Neg Hx   . Esophageal cancer Neg Hx   . Stomach cancer Neg Hx   . Rectal cancer Neg Hx     No Known Allergies  Current Outpatient Prescriptions on File Prior to Visit  Medication Sig Dispense Refill  . sertraline (ZOLOFT) 50 MG tablet Take  0.5-1 tablets (25-50 mg total) by mouth daily. 30 tablet 0  . SUMAtriptan (IMITREX) 100 MG tablet take 1 tablet by mouth immediately may repeat after 2 hours if needed 9 tablet 0  . zolpidem (AMBIEN) 10 MG tablet Take 0.5 tablets (5 mg total) by mouth at bedtime as needed for sleep. 30 tablet 3   No current facility-administered medications on file prior to visit.     Review of Systems:  All pertinent positive/negative included in HPI, all other review of systems are negative  Objective:  Physical Exam BP 100/67 mmHg  Pulse 91  Ht 5' 3.5" (1.613 m)  Wt 161 lb (73.029 kg)  BMI 28.07 kg/m2 CONSTITUTIONAL: Well-developed, well-nourished female in no acute distress.  EYES: EOM intact ENT: Normocephalic CARDIOVASCULAR: Regular rate and rhythm with no adventitious sounds.  RESPIRATORY: Normal rate. Clear to auscultation bilaterally.  ENDOCRINE: Normal thyroid.  MUSCULOSKELETAL: Normal RO SKIN: Warm, dry without erythema  NEUROLOGICAL: Alert, oriented, CN II-XII grossly intact, Appropriate balance.   PSYCH: Normal behavior, mood   Assessment & Plan:  Assessment:  1. Chronic migraine without aura without status migrainosus, not intractable   2. Muscle  spasm   3. Menopausal syndrome   4. Sleep disturbance      Plan: Will trial increasing zoloft as needed up to 100mg  daily.  (May use the extra 50mg  - or half tab of 50mg  - PRN) Continue triptan, NSAID, Ambien.   Follow-up in 12 months or sooner PRN  Paticia Stack, PA-C 04/24/2015 10:32 AM

## 2015-04-27 MED ORDER — SERTRALINE HCL 100 MG PO TABS: ORAL_TABLET | ORAL | Status: DC

## 2015-04-27 NOTE — Telephone Encounter (Signed)
Received fax from pharmacy that insurance would no longer cover two tablets/day for the Zoloft 50mg  one tablet twice daily. Changed rx to Zoloft 100mg  1/2 tablet twice daily to equal up to 100mg  daily. New prescription sent to pharmacy.

## 2015-07-20 ENCOUNTER — Ambulatory Visit: Payer: BC Managed Care – PPO

## 2015-07-24 ENCOUNTER — Ambulatory Visit
Admission: RE | Admit: 2015-07-24 | Discharge: 2015-07-24 | Disposition: A | Discharge status: Home / Self Care | Payer: BC Managed Care – PPO | Source: Ambulatory Visit

## 2015-07-24 DIAGNOSIS — Z1231 Encounter for screening mammogram for malignant neoplasm of breast: Secondary | ICD-10-CM

## 2015-08-05 ENCOUNTER — Encounter: Payer: Self-pay | Admitting: *Deleted

## 2015-08-07 NOTE — Discharge Instructions (Signed)
INSTRUCTIONS FOLLOWING OCULOPLASTIC SURGERY °AMY M. FOWLER, MD ° °AFTER YOUR EYE SURGERY, THER ARE MANY THINGS THWIHC YOU, THE PATIENT, CAN DO TO ASSURE THE BEST POSSIBLE RESULT FROM YOUR OPERATION.  THIS SHEET SHOULD BE REFERRED TO WHENEVER QUESTIONS ARISE.  IF THERE ARE ANY QUESTIONS NOT ANSWERED HERE, DO NOT HESITATE TO CALL OUR OFFICE AT [phone] OR [phone].  THERE IS ALWAYS [facility] AVAILABLE TO CALL IF QUESTIONS OR PROBLEMS ARISE. ° °VISION: Your vision may be blurred and out of focus after surgery until you are able to stop using your ointment, swelling resolves and your eye(s) heal. This may take 1 to 2 weeks at the least.  If your vision becomes gradually more dim or dark, this is not normal and you need to call our office immediately. ° °EYE CARE: For the first 48 hours after surgery, use ice packs frequently - “20 minutes on, 20 minutes off” - to help reduce swelling and bruising.  Small bags of frozen peas or corn make good ice packs along with cloths soaked in ice water.  If you are wearing a patch or other type of dressing following surgery, keep this on for the amount of time specified by your doctor.  For the first week following surgery, you will need to treat your stitches with great care.  If is OK to shower, but take care to not allow soapy water to run into your eye(s) to help reduce changes of infection.  You may gently clean the eyelashes and around the eye(s) with cotton balls and sterile water, BUT DO NOT RUB THE STITCHES VIGOROUSLY.  Keeping your stitches moist with ointment will help promote healing with minimal scar formation. ° °ACTIVITY: When you leave the surgery center, you should go home, rest and be inactive.  The eye(s) may feel scratchy and keeping the eyes closed will allow for faster healing.  The first week following surgery, avoid straining (anything making the face turn red) or lifting over 20 pounds.  Additionally, avoid bending which causes your head to go below  your waist.  Using your eyes will NOT harm them, so feel free to read, watch television, use the computer, etc as desired.  Driving depends on each individual, so check with your doctor if you have questions about driving. ° °MEDICATIONS:  You will be given a prescription for an ointment to use 4 times a day on your stitches.  You can use the ointment in your eyes if they feel scratchy or irritated.  If you eyelid(s) don’t close completely when you sleep, put some ointment in your eyes before bedtime. ° °EMERGENCY: If you experience SEVERE EYE PAIN OR HEADACHE UNRELIEVED BY TYLENOL OR PERCOCET, NAUSEA OR VOMITING, WORSENING REDNESS, OR WORSENING VISION (ESPECIALLY VISION THAT WA INITIALLY BETTER) CALL [phone] OR [phone] DURING BUSINESS HOURS OR AFTER HOURS. ° °General Anesthesia, Adult, Care After °Refer to this sheet in the next few weeks. These instructions provide you with information on caring for yourself after your procedure. Your health care provider may also give you more specific instructions. Your treatment has been planned according to current medical practices, but problems sometimes occur. Call your health care provider if you have any problems or questions after your procedure. °WHAT TO EXPECT AFTER THE PROCEDURE °After the procedure, it is typical to experience: °· Sleepiness. °· Nausea and vomiting. °HOME CARE INSTRUCTIONS °· For the first 24 hours after general anesthesia: °¨ Have a responsible person with you. °¨ Do not drive a car. If you   are alone, do not take public transportation. °¨ Do not drink alcohol. °¨ Do not take medicine that has not been prescribed by your health care provider. °¨ Do not sign important papers or make important decisions. °¨ You may resume a normal diet and activities as directed by your health care provider. °· Change bandages (dressings) as directed. °· If you have questions or problems that seem related to general anesthesia, call the hospital and ask for  the anesthetist or anesthesiologist on call. °SEEK MEDICAL CARE IF: °· You have nausea and vomiting that continue the day after anesthesia. °· You develop a rash. °SEEK IMMEDIATE MEDICAL CARE IF:  °· You have difficulty breathing. °· You have chest pain. °· You have any allergic problems. °  °This information is not intended to replace advice given to you by your health care provider. Make sure you discuss any questions you have with your health care provider. °  °Document Released: 04/18/2000 Document Revised: 01/31/2014 Document Reviewed: 05/11/2011 °Elsevier Interactive Patient Education ©2016 [employer] ° °

## 2015-08-11 ENCOUNTER — Encounter
Admission: RE | Disposition: A | Discharge status: Home / Self Care | Payer: Self-pay | Source: Ambulatory Visit | Attending: Ophthalmology

## 2015-08-11 ENCOUNTER — Ambulatory Visit: Payer: BC Managed Care – PPO | Admitting: Anesthesiology

## 2015-08-11 ENCOUNTER — Ambulatory Visit
Admission: RE | Admit: 2015-08-11 | Discharge: 2015-08-11 | Disposition: A | Discharge status: Home / Self Care | Payer: BC Managed Care – PPO | Source: Ambulatory Visit | Attending: Ophthalmology | Admitting: Ophthalmology

## 2015-08-11 DIAGNOSIS — K5790 Diverticulosis of intestine, part unspecified, without perforation or abscess without bleeding: Secondary | ICD-10-CM | POA: Diagnosis not present

## 2015-08-11 DIAGNOSIS — F329 Major depressive disorder, single episode, unspecified: Secondary | ICD-10-CM | POA: Insufficient documentation

## 2015-08-11 DIAGNOSIS — H02831 Dermatochalasis of right upper eyelid: Secondary | ICD-10-CM | POA: Diagnosis not present

## 2015-08-11 DIAGNOSIS — F419 Anxiety disorder, unspecified: Secondary | ICD-10-CM | POA: Insufficient documentation

## 2015-08-11 DIAGNOSIS — K589 Irritable bowel syndrome without diarrhea: Secondary | ICD-10-CM | POA: Insufficient documentation

## 2015-08-11 DIAGNOSIS — G43909 Migraine, unspecified, not intractable, without status migrainosus: Secondary | ICD-10-CM | POA: Insufficient documentation

## 2015-08-11 DIAGNOSIS — M7989 Other specified soft tissue disorders: Secondary | ICD-10-CM | POA: Diagnosis not present

## 2015-08-11 DIAGNOSIS — M1990 Unspecified osteoarthritis, unspecified site: Secondary | ICD-10-CM | POA: Diagnosis not present

## 2015-08-11 DIAGNOSIS — H02834 Dermatochalasis of left upper eyelid: Secondary | ICD-10-CM | POA: Insufficient documentation

## 2015-08-11 DIAGNOSIS — H02839 Dermatochalasis of unspecified eye, unspecified eyelid: Secondary | ICD-10-CM | POA: Diagnosis present

## 2015-08-11 DIAGNOSIS — Z9049 Acquired absence of other specified parts of digestive tract: Secondary | ICD-10-CM | POA: Diagnosis not present

## 2015-08-11 DIAGNOSIS — Z87891 Personal history of nicotine dependence: Secondary | ICD-10-CM | POA: Diagnosis not present

## 2015-08-11 HISTORY — PX: BROW LIFT: SHX178

## 2015-08-11 SURGERY — BLEPHAROPLASTY
Anesthesia: Monitor Anesthesia Care | Site: Eye | Laterality: Bilateral | Wound class: Clean

## 2015-08-11 MED ORDER — PROPOFOL 500 MG/50ML IV EMUL
INTRAVENOUS | Status: DC | PRN
  Administered 2015-08-11: 50 ug/kg/min via INTRAVENOUS

## 2015-08-11 MED ORDER — TETRACAINE HCL 0.5 % OP SOLN
OPHTHALMIC | Status: DC | PRN
  Administered 2015-08-11: 3 [drp] via OPHTHALMIC

## 2015-08-11 MED ORDER — BSS IO SOLN
INTRAOCULAR | Status: DC | PRN
Start: 2015-08-11 — End: 2015-08-11
  Administered 2015-08-11: 15 mL

## 2015-08-11 MED ORDER — LIDOCAINE-EPINEPHRINE 2 %-1:100000 IJ SOLN
INTRAMUSCULAR | Status: DC | PRN
  Administered 2015-08-11: 2 mL via OPHTHALMIC

## 2015-08-11 MED ORDER — ALFENTANIL 500 MCG/ML IJ INJ
INJECTION | INTRAMUSCULAR | Status: DC | PRN
  Administered 2015-08-11: 1000 ug via INTRAVENOUS

## 2015-08-11 MED ORDER — OXYCODONE HCL 5 MG PO TABS: 5.0000 mg | ORAL_TABLET | Freq: Once | ORAL | Status: DC | PRN

## 2015-08-11 MED ORDER — BACITRACIN 500 UNIT/GM OP OINT: TOPICAL_OINTMENT | OPHTHALMIC | Status: DC

## 2015-08-11 MED ORDER — MIDAZOLAM HCL 2 MG/2ML IJ SOLN
INTRAMUSCULAR | Status: DC | PRN
  Administered 2015-08-11: 2 mg via INTRAVENOUS

## 2015-08-11 MED ORDER — OXYCODONE-ACETAMINOPHEN 5-325 MG PO TABS: 1.0000 | ORAL_TABLET | ORAL | Status: DC | PRN

## 2015-08-11 MED ORDER — LIDOCAINE HCL (CARDIAC) 20 MG/ML IV SOLN
INTRAVENOUS | Status: DC | PRN
  Administered 2015-08-11: 30 mg via INTRAVENOUS

## 2015-08-11 MED ORDER — LACTATED RINGERS IV SOLN
INTRAVENOUS | Status: DC
  Administered 2015-08-11: 07:00:00 via INTRAVENOUS

## 2015-08-11 MED ORDER — OXYCODONE HCL 5 MG/5ML PO SOLN: 5.0000 mg | Freq: Once | ORAL | Status: DC | PRN

## 2015-08-11 MED ORDER — ERYTHROMYCIN 5 MG/GM OP OINT
TOPICAL_OINTMENT | OPHTHALMIC | Status: DC | PRN
  Administered 2015-08-11: 1 via OPHTHALMIC

## 2015-08-11 SURGICAL SUPPLY — 37 items
APPLICATOR COTTON TIP WD 3 STR (MISCELLANEOUS) ×3 IMPLANT
BLADE SURG 15 STRL LF DISP TIS (BLADE) ×1 IMPLANT
BLADE SURG 15 STRL SS (BLADE) ×3
CORD BIP STRL DISP 12FT (MISCELLANEOUS) ×3 IMPLANT
DRAPE HEAD BAR (DRAPES) ×3 IMPLANT
GAUZE SPONGE 4X4 12PLY STRL (GAUZE/BANDAGES/DRESSINGS) ×3 IMPLANT
GAUZE SPONGE NON-WVN 2X2 STRL (MISCELLANEOUS) ×10 IMPLANT
GLOVE SURG LX 7.0 MICRO (GLOVE) ×4
GLOVE SURG LX STRL 7.0 MICRO (GLOVE) ×2 IMPLANT
MARKER SKIN XFINE TIP W/RULER (MISCELLANEOUS) ×3 IMPLANT
NDL FILTER BLUNT 18X1 1/2 (NEEDLE) ×1 IMPLANT
NDL HYPO 30X.5 LL (NEEDLE) ×2 IMPLANT
NEEDLE FILTER BLUNT 18X 1/2SAF (NEEDLE) ×2
NEEDLE FILTER BLUNT 18X1 1/2 (NEEDLE) ×1 IMPLANT
NEEDLE HYPO 30X.5 LL (NEEDLE) ×6 IMPLANT
PACK DRAPE NASAL/ENT (PACKS) ×3 IMPLANT
SOL PREP PVP 2OZ (MISCELLANEOUS) ×3
SOLUTION PREP PVP 2OZ (MISCELLANEOUS) ×1 IMPLANT
SPONGE VERSALON 2X2 STRL (MISCELLANEOUS) ×30
SUT CHROMIC 4-0 (SUTURE)
SUT CHROMIC 4-0 M2 12X2 ARM (SUTURE)
SUT CHROMIC 5 0 P 3 (SUTURE) IMPLANT
SUT ETHILON 4 0 CL P 3 (SUTURE) IMPLANT
SUT MERSILENE 4-0 S-2 (SUTURE) IMPLANT
SUT PLAIN GUT (SUTURE) ×3 IMPLANT
SUT PROLENE 5 0 P 3 (SUTURE) IMPLANT
SUT PROLENE 6 0 P 1 18 (SUTURE) IMPLANT
SUT SILK 4 0 G 3 (SUTURE) IMPLANT
SUT VIC AB 5-0 P-3 18X BRD (SUTURE) IMPLANT
SUT VIC AB 5-0 P3 18 (SUTURE)
SUT VICRYL 6-0  S14 CTD (SUTURE)
SUT VICRYL 6-0 S14 CTD (SUTURE) IMPLANT
SUT VICRYL 7 0 TG140 8 (SUTURE) IMPLANT
SUTURE CHRMC 4-0 M2 12X2 ARM (SUTURE) IMPLANT
SYR 3ML LL SCALE MARK (SYRINGE) ×3 IMPLANT
SYRINGE 10CC LL (SYRINGE) ×3 IMPLANT
WATER STERILE IRR 500ML POUR (IV SOLUTION) ×3 IMPLANT

## 2015-08-11 NOTE — Interval H&P Note (Signed)
History and Physical Interval Note:  08/11/2015 7:25 AM  [name]  has presented today for surgery, with the diagnosis of H02.831 H02.834 DERMATOCHALASIS  The various methods of treatment have been discussed with the patient and family. After consideration of risks, benefits and other options for treatment, the patient has consented to  Procedure(s) with comments: BLEPHAROPLASTY (Bilateral) - per cynde please leave patient first as a surgical intervention .  The patient's history has been reviewed, patient examined, no change in status, stable for surgery.  I have reviewed the patient's chart and labs.  Questions were answered to the patient's satisfaction.     Vickki Muff, Tashira Torre M

## 2015-08-11 NOTE — Anesthesia Postprocedure Evaluation (Signed)
Anesthesia Post Note  Patient: [name]  Procedure(s) Performed: Procedure(s) (LRB): BLEPHAROPLASTY BILATERAL UPPER EYELIDS (Bilateral)  Patient location during evaluation: PACU Anesthesia Type: MAC Level of consciousness: awake and alert Pain management: pain level controlled Vital Signs Assessment: post-procedure vital signs reviewed and stable Respiratory status: spontaneous breathing, nonlabored ventilation, respiratory function stable and patient connected to nasal cannula oxygen Cardiovascular status: stable and blood pressure returned to baseline Anesthetic complications: no    Tezra Mahr

## 2015-08-11 NOTE — Anesthesia Procedure Notes (Signed)
Procedure Name: MAC Performed by: Helmut Hennon Pre-anesthesia Checklist: Patient identified, Emergency Drugs available, Suction available, Timeout performed and Patient being monitored Patient Re-evaluated:Patient Re-evaluated prior to inductionOxygen Delivery Method: Nasal cannula Placement Confirmation: positive ETCO2     

## 2015-08-11 NOTE — Anesthesia Preprocedure Evaluation (Signed)
Anesthesia Evaluation  Patient identified by MRN, date of birth, ID band  Reviewed: NPO status   History of Anesthesia Complications Negative for: history of anesthetic complications  Airway Mallampati: II  TM Distance: >3 FB Neck ROM: full    Dental no notable dental hx.    Pulmonary neg pulmonary ROS, former smoker,    Pulmonary exam normal        Cardiovascular Exercise Tolerance: Good negative cardio ROS Normal cardiovascular exam     Neuro/Psych  Headaches, Anxiety    GI/Hepatic Neg liver ROS, ibs    Endo/Other  negative endocrine ROS  Renal/GU negative Renal ROS  negative genitourinary   Musculoskeletal  (+) Arthritis ,   Abdominal   Peds  Hematology negative hematology ROS (+)   Anesthesia Other Findings   Reproductive/Obstetrics                             Anesthesia Physical Anesthesia Plan  ASA: II  Anesthesia Plan: MAC   Post-op Pain Management:    Induction:   Airway Management Planned:   Additional Equipment:   Intra-op Plan:   Post-operative Plan:   Informed Consent: I have reviewed the patients History and Physical, chart, labs and discussed the procedure including the risks, benefits and alternatives for the proposed anesthesia with the patient or authorized representative who has indicated his/her understanding and acceptance.     Plan Discussed with: CRNA  Anesthesia Plan Comments:         Anesthesia Quick Evaluation

## 2015-08-11 NOTE — H&P (Signed)
  See H&P completed at [facility] and scanned onto the chart

## 2015-08-11 NOTE — Op Note (Signed)
Preoperative Diagnosis:  Visually significant dermatochalasis both Upper Eyelid(s)  Postoperative Diagnosis:  Same.  Procedure(s) Performed:   Upper eyelid blepharoplasty with excess skin excision  bilateral Upper Eyelid(s)  Teaching Surgeon: Philis Pique. Vickki Muff, M.D.  Assistants: none  Anesthesia: MAC  Specimens: None.  Estimated Blood Loss: Minimal.  Complications: None.  Operative Findings: None Dictated  Procedure:   Review of patient's allergies indicates no known allergies..   After the risks, benefits, complications and alternatives were discussed with the patient, appropriate informed consent was obtained and the patient was brought to the operating suite. The patient was reclined supine and a timeout was conducted.  The patient was then sedated.  Local anesthetic consisting of a 50-50 mixture of 2% lidocaine with epinephrine and 0.75% bupivacaine with added Hylenex was injected subcutaneously to both upper eyelid(s). After adequate local was instilled, the patient was prepped and draped in the usual sterile fashion for eyelid surgery.   Attention was turned to the upper eyelids. A 8.47mm upper eyelid crease incision line was marked with calipers on both upper eyelid(s).  A pinch test was used to estimate the amount of excess skin to remove and this was marked in standard blepharoplasty style fashion. Attention was turned to the  right upper eyelid. A #15 blade was used to open the premarked incision line. A skin only flap was excised and hemostasis was obtained with bipolar cautery.   Attention was then turned to the opposite eyelid where the same procedure was performed in the same manner. Hemostasis was obtained with bipolar cautery throughout. All incisions were then closed with a combination of running and interrupted 6-0 fast absorbing plain suture. The patient tolerated the procedure well.  Erythromycin Ophthalmic ointment was applied to her incision sites, followed by ice  packs. She was taken to the recovery area where she recovered without difficulty.  Post-Op Plan/Instructions:  The patient was instructed to use ice packs frequently for the next 48 hours. She was instructed to use bacitracin ophthalmic ointment on her incisions 4 times a day for the next 12 to 14 days. She was given a prescription for Percocet for pain control should Tylenol not be effective. She was asked to to follow up in 2 weeks' time at the [facility] in [place], [place] or sooner as needed for problems.  Jonny Longino M. Vickki Muff, M.D. Attending,Ophthalmology

## 2015-08-11 NOTE — Addendum Note (Signed)
Addendum  created 08/11/15 1045 by Londell Moh, CRNA   Modules edited: Anesthesia Flowsheet

## 2015-08-11 NOTE — Transfer of Care (Signed)
Immediate Anesthesia Transfer of Care Note  Patient: [name]  Procedure(s) Performed: Procedure(s) with comments: BLEPHAROPLASTY BILATERAL UPPER EYELIDS (Bilateral) - per cynde please leave patient first  Patient Location: PACU  Anesthesia Type: MAC  Level of Consciousness: awake, alert  and patient cooperative  Airway and Oxygen Therapy: Patient Spontanous Breathing and Patient connected to supplemental oxygen  Post-op Assessment: Post-op Vital signs reviewed, Patient's Cardiovascular Status Stable, Respiratory Function Stable, Patent Airway and No signs of Nausea or vomiting  Post-op Vital Signs: Reviewed and stable  Complications: No apparent anesthesia complications

## 2015-08-12 ENCOUNTER — Encounter: Payer: Self-pay | Admitting: Ophthalmology

## 2015-09-09 ENCOUNTER — Telehealth: Payer: Self-pay | Admitting: *Deleted

## 2015-09-09 DIAGNOSIS — G43709 Chronic migraine without aura, not intractable, without status migrainosus: Secondary | ICD-10-CM

## 2015-09-09 NOTE — Telephone Encounter (Addendum)
Pt called stating insurance will cover 12 tablets for the Imitrex instead of the usual 9 tablets and is requesting to have the rx changed.  Request approved by Allie Dimmer, new rx sent to pharmacy.

## 2015-09-11 MED ORDER — SUMATRIPTAN SUCCINATE 100 MG PO TABS: 100.0000 mg | ORAL_TABLET | Freq: Once | ORAL | 7 refills | Status: DC

## 2015-10-16 ENCOUNTER — Encounter: Payer: Self-pay | Admitting: *Deleted

## 2015-12-07 ENCOUNTER — Telehealth: Payer: Self-pay | Admitting: *Deleted

## 2015-12-07 MED ORDER — ZOLPIDEM TARTRATE 10 MG PO TABS: 5.0000 mg | ORAL_TABLET | Freq: Every evening | ORAL | 3 refills | Status: DC | PRN

## 2015-12-07 NOTE — Telephone Encounter (Signed)
Refilled Ambien per pharmacy request via fax

## 2015-12-23 ENCOUNTER — Telehealth: Payer: Self-pay | Admitting: *Deleted

## 2015-12-23 ENCOUNTER — Encounter: Payer: Self-pay | Admitting: Internal Medicine

## 2015-12-23 NOTE — Telephone Encounter (Signed)
PT wrote in via [place] requesting an appointment for her Hep C Screening. Please advise.

## 2015-12-23 NOTE — Telephone Encounter (Signed)
MyChart sent to pt.

## 2015-12-23 NOTE — Telephone Encounter (Signed)
Let her know we can just add it on to blood work at her next physical

## 2016-02-02 ENCOUNTER — Encounter: Payer: BC Managed Care – PPO | Admitting: Physician Assistant

## 2016-02-08 ENCOUNTER — Encounter: Payer: Self-pay | Admitting: Physician Assistant

## 2016-02-08 ENCOUNTER — Encounter: Payer: Self-pay | Admitting: Gastroenterology

## 2016-02-08 ENCOUNTER — Ambulatory Visit (INDEPENDENT_AMBULATORY_CARE_PROVIDER_SITE_OTHER): Payer: BC Managed Care – PPO | Admitting: Physician Assistant

## 2016-02-08 VITALS — BP 112/74 | HR 74 | Resp 18 | Ht 63.5 in | Wt 161.0 lb

## 2016-02-08 DIAGNOSIS — M62838 Other muscle spasm: Secondary | ICD-10-CM | POA: Diagnosis not present

## 2016-02-08 DIAGNOSIS — M5481 Occipital neuralgia: Secondary | ICD-10-CM | POA: Diagnosis not present

## 2016-02-08 DIAGNOSIS — G43101 Migraine with aura, not intractable, with status migrainosus: Secondary | ICD-10-CM | POA: Insufficient documentation

## 2016-02-08 DIAGNOSIS — G4482 Headache associated with sexual activity: Secondary | ICD-10-CM | POA: Diagnosis not present

## 2016-02-08 MED ORDER — INDOMETHACIN 25 MG PO CAPS: 25.0000 mg | ORAL_CAPSULE | Freq: Every day | ORAL | 1 refills | Status: DC | PRN

## 2016-02-08 NOTE — Patient Instructions (Signed)
Migraine Headache A migraine headache is an intense, throbbing pain on one side or both sides of the head. Migraines may also cause other symptoms, such as nausea, vomiting, and sensitivity to light and noise. What are the causes? Doing or taking certain things may also trigger migraines, such as:  Alcohol.  Smoking.  Medicines, such as: ? Medicine used to treat chest pain (nitroglycerine). ? Birth control pills. ? Estrogen pills. ? Certain blood pressure medicines.  Aged cheeses, chocolate, or caffeine.  Foods or drinks that contain nitrates, glutamate, aspartame, or tyramine.  Physical activity.  Other things that may trigger a migraine include:  Menstruation.  Pregnancy.  Hunger.  Stress, lack of sleep, too much sleep, or fatigue.  Weather changes.  What increases the risk? The following factors may make you more likely to experience migraine headaches:  Age. Risk increases with age.  Family history of migraine headaches.  Being Caucasian.  Depression and anxiety.  Obesity.  Being a woman.  Having a hole in the heart (patent foramen ovale) or other heart problems.  What are the signs or symptoms? The main symptom of this condition is pulsating or throbbing pain. Pain may:  Happen in any area of the head, such as on one side or both sides.  Interfere with daily activities.  Get worse with physical activity.  Get worse with exposure to bright lights or loud noises.  Other symptoms may include:  Nausea.  Vomiting.  Dizziness.  General sensitivity to bright lights, loud noises, or smells.  Before you get a migraine, you may get warning signs that a migraine is developing (aura). An aura may include:  Seeing flashing lights or having blind spots.  Seeing bright spots, halos, or zigzag lines.  Having tunnel vision or blurred vision.  Having numbness or a tingling feeling.  Having trouble talking.  Having muscle weakness.  How is this  diagnosed? A migraine headache can be diagnosed based on:  Your symptoms.  A physical exam.  Tests, such as CT scan or MRI of the head. These imaging tests can help rule out other causes of headaches.  Taking fluid from the spine (lumbar puncture) and analyzing it (cerebrospinal fluid analysis, or CSF analysis).  How is this treated? A migraine headache is usually treated with medicines that:  Relieve pain.  Relieve nausea.  Prevent migraines from coming back.  Treatment may also include:  Acupuncture.  Lifestyle changes like avoiding foods that trigger migraines.  Follow these instructions at home: Medicines  Take over-the-counter and prescription medicines only as told by your health care provider.  Do not drive or use heavy machinery while taking prescription pain medicine.  To prevent or treat constipation while you are taking prescription pain medicine, your health care provider may recommend that you: ? Drink enough fluid to keep your urine clear or pale yellow. ? Take over-the-counter or prescription medicines. ? Eat foods that are high in fiber, such as fresh fruits and vegetables, whole grains, and beans. ? Limit foods that are high in fat and processed sugars, such as fried and sweet foods. Lifestyle  Avoid alcohol use.  Do not use any products that contain nicotine or tobacco, such as cigarettes and e-cigarettes. If you need help quitting, ask your health care provider.  Get at least 8 hours of sleep every night.  Limit your stress. General instructions   Keep a journal to find out what may trigger your migraine headaches. For example, write down: ? What you eat and   drink. ? How much sleep you get. ? Any change to your diet or medicines.  If you have a migraine: ? Avoid things that make your symptoms worse, such as bright lights. ? It may help to lie down in a dark, quiet room. ? Do not drive or use heavy machinery. ? Ask your health care provider  what activities are safe for you while you are experiencing symptoms.  Keep all follow-up visits as told by your health care provider. This is important. Contact a health care provider if:  You develop symptoms that are different or more severe than your usual migraine symptoms. Get help right away if:  Your migraine becomes severe.  You have a fever.  You have a stiff neck.  You have vision loss.  Your muscles feel weak or like you cannot control them.  You start to lose your balance often.  You develop trouble walking.  You faint. This information is not intended to replace advice given to you by your health care provider. Make sure you discuss any questions you have with your health care provider. Document Released: 01/10/2005 Document Revised: 07/31/2015 Document Reviewed: 06/29/2015 Elsevier Interactive Patient Education  2017 Elsevier Inc.   

## 2016-02-08 NOTE — Progress Notes (Signed)
History:  [name] is a 61 y.o. G2P2 who presents to clinic today for headaches.  She has a new problem.  New Year's this year had different HA.  It was sudden onset, during sex, had small amount of wine prior.  Did not remember thunder clap but it was sudden onset, very severe, at climax, unresponsive to regular meds.   For several days she had lingering pain.  HA's are more localized to left parietal which is typical of her migraines.  She has also returned to having more typical migraines - some with aura.  She is not sure if this is one continuous headache since the initial incident or separate attacks.  Strong family h/o aneurysm.  Her sister had 2 and died of this.  Her brother has one that is being monitored.  She last had MRA 7-10 years ago.  No issue with BP, blood sugar.    HIT6:58 Number of days in the last 4 weeks with:  Severe headache: 4 Moderate headache: 8 Mild headache: 2  No headache: 14   Past Medical History:  Diagnosis Date  . AR (allergic rhinitis)   . IBS (irritable bowel syndrome)   . Migraines    1-3x/wk  . Osteoarthritis   . Osteopenia     Social History   Social History  . Marital status: Married    Spouse name: N/A  . Number of children: 2  . Years of education: N/A   Occupational History  . 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 Smoking status: Former Smoker    Years: 12.00  . Smokeless tobacco: Former Systems developer    Quit date: 01/24/1997  . Alcohol use 1.8 oz/week    2 Standard drinks or equivalent, 1 Cans of beer per week     Comment: Rare  . Drug use: No  . Sexual activity: Yes    Partners: Male   Other Topics Concern  . Not on file   Social History Narrative  . No narrative on file    Family History  Problem Relation Age of Onset  . Macular degeneration Mother   . Hypertension Father   . Diabetes Father   . Hypertension Sister   . Cerebral aneurysm Sister   . Cancer Maternal Aunt     breast   .  Diabetes Paternal Grandfather   . Colon cancer Neg Hx   . Esophageal cancer Neg Hx   . Stomach cancer Neg Hx   . Rectal cancer Neg Hx     No Known Allergies  Current Outpatient Prescriptions on File Prior to Visit  Medication Sig Dispense Refill  . Cyanocobalamin (VITAMIN B-12 PO) Take by mouth.    . estradiol (ESTRACE) 0.5 MG tablet Take 0.5 mg by mouth daily.    . progesterone (PROMETRIUM) 200 MG capsule   0  . sertraline (ZOLOFT) 100 MG tablet Take 1/2 tablet by mouth twice a day. 30 tablet 11  . zolpidem (AMBIEN) 10 MG tablet Take 0.5 tablets (5 mg total) by mouth at bedtime as needed for sleep. 30 tablet 3  . SUMAtriptan (IMITREX) 100 MG tablet Take 1 tablet (100 mg total) by mouth once. May repeat in 2 hours if headache persists or recurs. 12 tablet 7   No current facility-administered medications on file prior to visit.      Review of Systems:  All pertinent positive/negative included in HPI, all other review of systems are negative  Objective:  Physical Exam BP 112/74 (BP Location: Left Arm, Patient Position: Sitting, Cuff Size: Large)   Pulse 74   Resp 18   Ht 5' 3.5" (1.613 m)   Wt 161 lb (73 kg)   BMI 28.07 kg/m  CONSTITUTIONAL: Well-developed, well-nourished female in no acute distress.  EYES: EOM intact ENT: Normocephalic Head: Significant tenderness over left greater occipital nerve and lesser occipital nerve  CARDIOVASCULAR: Regular rate and rhythm with no adventitious sounds.  RESPIRATORY: Normal rate. Clear to auscultation bilaterally.  MUSCULOSKELETAL: Normal ROM, strength equal bilaterally, muscle spasm noted left cervical paraspinal and trapezius SKIN: Warm, dry without erythema  NEUROLOGICAL: Alert, oriented, CN II-XII grossly intact, Appropriate balance PSYCH: Normal behavior, mood  PROCEDURE:  TRIGGER POINT INJECTIONS  Procedure: Mixture of 1%  Lidocaine, marcaine and dexamethazone in a ratio of 2:2:1  injected with 1cc each site in left greater  Occipital Nerve, left lesser occipital nerves, left cervical paraspinal muscles, left trapezius.  Total amount injected: 5cc.  Pt tolerated procedure well.  She indicated some improvement but not complete resolution of symptoms prior to discharge.  Assessment & Plan:  Assessment: 1. Headache associated with sexual activity   2. Migraine with aura and with status migrainosus, not intractable   3. Occipital neuralgia of left side   4. Muscle spasm    New diagnosis for new HA type  Plan: Will order MRI brain with contrast and MRA for new HA onset/family h/o aneurysm. Pt advised go to ED with new, severe HA symptoms.   Continue current meds for now - while awaiting imaging.  Hopefully no issue and HA will return to baseline with today's injections.  Indocin preventive prior to sexual activity.  Start with 25mg  prior to sexual activity.  May increase to 2 caps if needed.   Follow-up in 6 months or sooner PRN  Paticia Stack, PA-C 02/08/2016 8:27 AM

## 2016-02-16 ENCOUNTER — Other Ambulatory Visit: Payer: Self-pay | Admitting: *Deleted

## 2016-02-16 DIAGNOSIS — G43809 Other migraine, not intractable, without status migrainosus: Secondary | ICD-10-CM

## 2016-02-17 ENCOUNTER — Telehealth: Payer: Self-pay | Admitting: Gastroenterology

## 2016-02-18 NOTE — Telephone Encounter (Signed)
Explained to patient that at her last colonoscopy in 2013 there were no polyps and therefore that is why the recall letter was sent out with the new guideline recommendations.

## 2016-02-23 ENCOUNTER — Telehealth: Payer: Self-pay | Admitting: *Deleted

## 2016-02-23 NOTE — Telephone Encounter (Signed)
-----   Message from Blanchie Dessert, [place] sent at 02/22/2016  4:40 PM EST ----- Regarding: needs clarification on Procedure that was ordered Contact: [phone] Please call Shantoya, she is confused about what procedure that Laverle Hobby has her scheduled for 02/24/16. Was understanding that she was having an MRI done, But [facility] ordered MRI & MRA.  Thanks..   PS LOVE YA'LL

## 2016-02-23 NOTE — Telephone Encounter (Signed)
Infomed pt of imaging that was ordered and answered questions regarding MRI.

## 2016-02-24 ENCOUNTER — Ambulatory Visit
Admission: RE | Admit: 2016-02-24 | Discharge: 2016-02-24 | Disposition: A | Discharge status: Home / Self Care | Payer: BC Managed Care – PPO | Source: Ambulatory Visit | Attending: Physician Assistant | Admitting: Physician Assistant

## 2016-02-24 DIAGNOSIS — G43809 Other migraine, not intractable, without status migrainosus: Secondary | ICD-10-CM | POA: Insufficient documentation

## 2016-02-24 MED ORDER — GADOBENATE DIMEGLUMINE 529 MG/ML IV SOLN
15.0000 mL | Freq: Once | INTRAVENOUS | Status: AC | PRN
  Administered 2016-02-24: 15 mL via INTRAVENOUS

## 2016-04-01 ENCOUNTER — Other Ambulatory Visit: Payer: Self-pay | Admitting: Physician Assistant

## 2016-04-01 DIAGNOSIS — G4482 Headache associated with sexual activity: Secondary | ICD-10-CM

## 2016-04-05 NOTE — Telephone Encounter (Signed)
Refill for Indomethacin sent to pharmacy per Allie Dimmer order.

## 2016-04-08 ENCOUNTER — Encounter: Payer: BC Managed Care – PPO | Admitting: Physician Assistant

## 2016-04-30 ENCOUNTER — Other Ambulatory Visit: Payer: Self-pay | Admitting: Physician Assistant

## 2016-04-30 DIAGNOSIS — G43709 Chronic migraine without aura, not intractable, without status migrainosus: Secondary | ICD-10-CM

## 2016-05-03 NOTE — Telephone Encounter (Signed)
Refill for Zoloft sent to [facility].

## 2016-06-28 ENCOUNTER — Other Ambulatory Visit: Payer: Self-pay | Admitting: Physician Assistant

## 2016-06-28 DIAGNOSIS — G43709 Chronic migraine without aura, not intractable, without status migrainosus: Secondary | ICD-10-CM

## 2016-06-30 NOTE — Telephone Encounter (Signed)
Rx refill sent to pharmacy per Allie Dimmer order.

## 2016-07-15 ENCOUNTER — Encounter: Payer: Self-pay | Admitting: Physician Assistant

## 2016-07-15 ENCOUNTER — Ambulatory Visit (INDEPENDENT_AMBULATORY_CARE_PROVIDER_SITE_OTHER): Payer: BC Managed Care – PPO | Admitting: Physician Assistant

## 2016-07-15 VITALS — BP 122/72 | HR 72 | Ht 63.5 in | Wt 153.0 lb

## 2016-07-15 DIAGNOSIS — G43709 Chronic migraine without aura, not intractable, without status migrainosus: Secondary | ICD-10-CM | POA: Diagnosis not present

## 2016-07-15 DIAGNOSIS — G4700 Insomnia, unspecified: Secondary | ICD-10-CM | POA: Diagnosis not present

## 2016-07-15 DIAGNOSIS — G43009 Migraine without aura, not intractable, without status migrainosus: Secondary | ICD-10-CM

## 2016-07-15 DIAGNOSIS — G4482 Headache associated with sexual activity: Secondary | ICD-10-CM

## 2016-07-15 DIAGNOSIS — M62838 Other muscle spasm: Secondary | ICD-10-CM | POA: Diagnosis not present

## 2016-07-15 DIAGNOSIS — G43101 Migraine with aura, not intractable, with status migrainosus: Secondary | ICD-10-CM | POA: Diagnosis not present

## 2016-07-15 MED ORDER — ZOLPIDEM TARTRATE 10 MG PO TABS: 5.0000 mg | ORAL_TABLET | Freq: Every evening | ORAL | 3 refills | Status: DC | PRN

## 2016-07-15 MED ORDER — SUMATRIPTAN SUCCINATE 100 MG PO TABS: ORAL_TABLET | ORAL | 11 refills | Status: DC

## 2016-07-15 MED ORDER — SERTRALINE HCL 100 MG PO TABS: 50.0000 mg | ORAL_TABLET | Freq: Two times a day (BID) | ORAL | 5 refills | Status: DC

## 2016-07-15 NOTE — Progress Notes (Signed)
History:  [name] is a 61 y.o. G2P2 who presents to clinic today for HA followup.  She has not had any further problems with sex-induced headache since starting the prophylactic indocin.    Her MRA and MRI from January were negative and no further studies are required.  She has a strong family h/o brain aneurysm but this was excluded for her by these scans.  Her headaches have been typical.  She is doing well with zoloft - using 50mg  routinely but occasionally using the full 100mg  as needed.  Imitrex is effective.    HIT6:56 Number of days in the last 4 weeks with:  Severe headache: 2 Moderate headache: 4 Mild headache: 7  No headache: 15   Past Medical History:  Diagnosis Date  . AR (allergic rhinitis)   . IBS (irritable bowel syndrome)   . Migraines    1-3x/wk  . Osteoarthritis   . Osteopenia     Social History   Social History  . Marital status: Married    Spouse name: N/A  . Number of children: 2  . Years of education: N/A   Occupational History  . 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 Smoking status: Former Smoker    Years: 12.00  . Smokeless tobacco: Former Systems developer    Quit date: 01/24/1997  . Alcohol use 1.8 oz/week    2 Standard drinks or equivalent, 1 Cans of beer per week     Comment: Rare  . Drug use: No  . Sexual activity: Yes    Partners: Male   Other Topics Concern  . Not on file   Social History Narrative  . No narrative on file    Family History  Problem Relation Age of Onset  . Macular degeneration Mother   . Hypertension Father   . Diabetes Father   . Hypertension Sister   . Cerebral aneurysm Sister   . Cancer Maternal Aunt        breast   . Diabetes Paternal Grandfather   . Colon cancer Neg Hx   . Esophageal cancer Neg Hx   . Stomach cancer Neg Hx   . Rectal cancer Neg Hx     No Known Allergies  Current Outpatient Prescriptions on File Prior to Visit  Medication Sig Dispense Refill  . Ascorbic Acid  (VITAMIN C) 1000 MG tablet Take 1,000 mg by mouth daily.    Marland Kitchen estradiol (ESTRACE) 0.5 MG tablet Take 0.5 mg by mouth daily.    . indomethacin (INDOCIN) 25 MG capsule take 1 capsule by mouth once daily if needed 30 capsule 1  . progesterone (PROMETRIUM) 200 MG capsule   0  . sertraline (ZOLOFT) 100 MG tablet take 1/2 tablet by mouth twice a day 30 tablet 2  . SUMAtriptan (IMITREX) 100 MG tablet take 1 tablet by mouth immediately may repeat after 2 hours if needed 12 tablet 1  . zolpidem (AMBIEN) 10 MG tablet Take 0.5 tablets (5 mg total) by mouth at bedtime as needed for sleep. 30 tablet 3  . Cyanocobalamin (VITAMIN B-12 PO) Take by mouth.     No current facility-administered medications on file prior to visit.      Review of Systems:  All pertinent positive/negative included in HPI, all other review of systems are negative   Objective:  Physical Exam BP 122/72   Pulse 72   Ht 5' 3.5" (1.613 m)   Wt 153 lb (69.4  kg)   BMI 26.68 kg/m  CONSTITUTIONAL: Well-developed, well-nourished female in no acute distress.  EYES: EOM intact ENT: Normocephalic CARDIOVASCULAR: Regular rate RESPIRATORY: Normal rate.  MUSCULOSKELETAL: Normal ROM SKIN: Warm, dry without erythema  NEUROLOGICAL: Alert, oriented, CN II-XII grossly intact, Appropriate balance PSYCH: Normal behavior, mood   Assessment & Plan:  Assessment: 1. Migraine without aura and without status migrainosus, not intractable   2. Chronic migraine without aura without status migrainosus, not intractable   3. Insomnia, unspecified type   4. Headache associated with sexual activity   5. Migraine with aura and with status migrainosus, not intractable   6. Muscle spasm      Improvement in HA assoc with sexual activity.  Otherwise no change in other HA.  Plan: Continue meds as refilled May consider CGRP blocker at next visit Follow-up in 6 months or sooner PRN  Paticia Stack, PA-C 07/15/2016 11:26 AM

## 2016-07-15 NOTE — Patient Instructions (Signed)
Migraine Headache A migraine headache is an intense, throbbing pain on one side or both sides of the head. Migraines may also cause other symptoms, such as nausea, vomiting, and sensitivity to light and noise. What are the causes? Doing or taking certain things may also trigger migraines, such as:  Alcohol.  Smoking.  Medicines, such as: ? Medicine used to treat chest pain (nitroglycerine). ? Birth control pills. ? Estrogen pills. ? Certain blood pressure medicines.  Aged cheeses, chocolate, or caffeine.  Foods or drinks that contain nitrates, glutamate, aspartame, or tyramine.  Physical activity.  Other things that may trigger a migraine include:  Menstruation.  Pregnancy.  Hunger.  Stress, lack of sleep, too much sleep, or fatigue.  Weather changes.  What increases the risk? The following factors may make you more likely to experience migraine headaches:  Age. Risk increases with age.  Family history of migraine headaches.  Being Caucasian.  Depression and anxiety.  Obesity.  Being a woman.  Having a hole in the heart (patent foramen ovale) or other heart problems.  What are the signs or symptoms? The main symptom of this condition is pulsating or throbbing pain. Pain may:  Happen in any area of the head, such as on one side or both sides.  Interfere with daily activities.  Get worse with physical activity.  Get worse with exposure to bright lights or loud noises.  Other symptoms may include:  Nausea.  Vomiting.  Dizziness.  General sensitivity to bright lights, loud noises, or smells.  Before you get a migraine, you may get warning signs that a migraine is developing (aura). An aura may include:  Seeing flashing lights or having blind spots.  Seeing bright spots, halos, or zigzag lines.  Having tunnel vision or blurred vision.  Having numbness or a tingling feeling.  Having trouble talking.  Having muscle weakness.  How is this  diagnosed? A migraine headache can be diagnosed based on:  Your symptoms.  A physical exam.  Tests, such as CT scan or MRI of the head. These imaging tests can help rule out other causes of headaches.  Taking fluid from the spine (lumbar puncture) and analyzing it (cerebrospinal fluid analysis, or CSF analysis).  How is this treated? A migraine headache is usually treated with medicines that:  Relieve pain.  Relieve nausea.  Prevent migraines from coming back.  Treatment may also include:  Acupuncture.  Lifestyle changes like avoiding foods that trigger migraines.  Follow these instructions at home: Medicines  Take over-the-counter and prescription medicines only as told by your health care provider.  Do not drive or use heavy machinery while taking prescription pain medicine.  To prevent or treat constipation while you are taking prescription pain medicine, your health care provider may recommend that you: ? Drink enough fluid to keep your urine clear or pale yellow. ? Take over-the-counter or prescription medicines. ? Eat foods that are high in fiber, such as fresh fruits and vegetables, whole grains, and beans. ? Limit foods that are high in fat and processed sugars, such as fried and sweet foods. Lifestyle  Avoid alcohol use.  Do not use any products that contain nicotine or tobacco, such as cigarettes and e-cigarettes. If you need help quitting, ask your health care provider.  Get at least 8 hours of sleep every night.  Limit your stress. General instructions   Keep a journal to find out what may trigger your migraine headaches. For example, write down: ? What you eat and   drink. ? How much sleep you get. ? Any change to your diet or medicines.  If you have a migraine: ? Avoid things that make your symptoms worse, such as bright lights. ? It may help to lie down in a dark, quiet room. ? Do not drive or use heavy machinery. ? Ask your health care provider  what activities are safe for you while you are experiencing symptoms.  Keep all follow-up visits as told by your health care provider. This is important. Contact a health care provider if:  You develop symptoms that are different or more severe than your usual migraine symptoms. Get help right away if:  Your migraine becomes severe.  You have a fever.  You have a stiff neck.  You have vision loss.  Your muscles feel weak or like you cannot control them.  You start to lose your balance often.  You develop trouble walking.  You faint. This information is not intended to replace advice given to you by your health care provider. Make sure you discuss any questions you have with your health care provider. Document Released: 01/10/2005 Document Revised: 07/31/2015 Document Reviewed: 06/29/2015 Elsevier Interactive Patient Education  2017 Elsevier Inc.   

## 2016-11-25 ENCOUNTER — Ambulatory Visit: Payer: Self-pay

## 2016-11-25 NOTE — Telephone Encounter (Signed)
   Reason for Disposition . Cough with cold symptoms (e.g., runny nose, postnasal drip, throat clearing)  Answer Assessment - Initial Assessment Questions 1. ONSET: "When did the cough begin?"      11/23/16 2. SEVERITY: "How bad is the cough today?"      Having coughing episodes last 1-2 minutes  3. RESPIRATORY DISTRESS: "Describe your breathing."      Normal breathing 4. FEVER: "Do you have a fever?" If so, ask: "What is your temperature, how was it measured, and when did it start?"     No fever 5. SPUTUM: "Describe the color of your sputum" (clear, white, yellow, green)     Clear to yellowish green 6. HEMOPTYSIS: "Are you coughing up any blood?" If so ask: "How much?" (flecks, streaks, tablespoons, etc.)     no 7. CARDIAC HISTORY: "Do you have any history of heart disease?" (e.g., heart attack, congestive heart failure)      no 8. LUNG HISTORY: "Do you have any history of lung disease?"  (e.g., pulmonary embolus, asthma, emphysema) no     9. PE RISK FACTORS: "Do you have a history of blood clots?" (or: recent major surgery, recent prolonged travel, bedridden )     no 10. OTHER SYMPTOMS: "Do you have any other symptoms?" (e.g., runny nose, wheezing, chest pain)       "little" runny nose; no wheezing; "when I cough it burns down in the base of my neck" 11. PREGNANCY: "Is there any chance you are pregnant?" "When was your last menstrual period?" n/a 12. TRAVEL: "Have you traveled out of the country in the last month?" (e.g., travel history, exposures)       no  Protocols used: [place]

## 2016-12-01 ENCOUNTER — Encounter: Payer: Self-pay | Admitting: Radiology

## 2016-12-01 ENCOUNTER — Other Ambulatory Visit: Payer: Self-pay | Admitting: Physician Assistant

## 2016-12-01 DIAGNOSIS — G43709 Chronic migraine without aura, not intractable, without status migrainosus: Secondary | ICD-10-CM

## 2016-12-02 ENCOUNTER — Ambulatory Visit: Payer: BC Managed Care – PPO | Admitting: Internal Medicine

## 2016-12-02 ENCOUNTER — Encounter: Payer: Self-pay | Admitting: Internal Medicine

## 2016-12-02 VITALS — BP 110/70 | HR 73 | Temp 98.1°F | Wt 158.0 lb

## 2016-12-02 DIAGNOSIS — J069 Acute upper respiratory infection, unspecified: Secondary | ICD-10-CM | POA: Diagnosis not present

## 2016-12-02 NOTE — Progress Notes (Signed)
Subjective:    Patient ID: [name], female    DOB: [birth year], 61 y.o.   MRN: [id number]  HPI Here due to respiratory illness  Has been sick for 8 days Started with sore throat--then got worse Bad cough and congestion No fever No chills or sweats No SOB  Using robitussin DM and lots of OJ Using nasal spray  Has persisted  Feels some in her eyes Slightly dizzy  No ear pain --but had some pressure briefly  Current Outpatient Medications on File Prior to Visit  Medication Sig Dispense Refill  . Ascorbic Acid (VITAMIN C) 1000 MG tablet Take 1,000 mg by mouth daily.    . cholecalciferol (VITAMIN D) 1000 units tablet Take 1,000 Units by mouth daily.    . Cyanocobalamin (VITAMIN B-12 PO) Take by mouth.    . estradiol (ESTRACE) 0.5 MG tablet Take 0.5 mg by mouth daily.    . indomethacin (INDOCIN) 25 MG capsule take 1 capsule by mouth once daily if needed 30 capsule 1  . progesterone (PROMETRIUM) 200 MG capsule   0  . sertraline (ZOLOFT) 100 MG tablet Take 0.5 tablets (50 mg total) by mouth 2 (two) times daily. 30 tablet 5  . SUMAtriptan (IMITREX) 100 MG tablet take 1 tablet by mouth immediately may repeat after 2 hours if needed 12 tablet 11  . zolpidem (AMBIEN) 10 MG tablet Take 0.5 tablets (5 mg total) by mouth at bedtime as needed for sleep. 30 tablet 3   No current facility-administered medications on file prior to visit.     No Known Allergies  Past Medical History:  Diagnosis Date  . AR (allergic rhinitis)   . IBS (irritable bowel syndrome)   . Migraines    1-3x/wk  . Osteoarthritis   . Osteopenia     Past Surgical History:  Procedure Laterality Date  . CARPAL TUNNEL RELEASE  3/12   right. Left done 2010. Dr Fredna Dow and left side 2012  . CHOLECYSTECTOMY  4/85  . COLONOSCOPY    . DILATION AND CURETTAGE OF UTERUS  10/05  . tonsillectomy  12/76  . TUBAL LIGATION  1990  . VAGINAL DELIVERY     x 2    Family History  Problem Relation Age of Onset  .  Macular degeneration Mother   . Hypertension Father   . Diabetes Father   . Hypertension Sister   . Cerebral aneurysm Sister   . Cancer Maternal Aunt        breast   . Diabetes Paternal Grandfather   . Colon cancer Neg Hx   . Esophageal cancer Neg Hx   . Stomach cancer Neg Hx   . Rectal cancer Neg Hx     Social History   Socioeconomic History  . Marital status: Married    Spouse name: Not on file  . Number of children: 2  . Years of education: Not on file  . Highest education level: Not on file  Social Needs  . Financial resource strain: Not on file  . Food insecurity - worry: Not on file  . Food insecurity - inability: Not on file  . Transportation needs - medical: Not on file  . Transportation needs - non-medical: Not on file  Occupational History  . Occupation: Higher education careers adviser    Comment: [employer]   Tobacco Use  . Smoking status: Former Smoker    Years: 12.00  . Smokeless tobacco: Former Systems developer    Quit date: 01/24/1997  Substance and Sexual  Activity  . Alcohol use: Yes    Alcohol/week: 1.8 oz    Types: 2 Standard drinks or equivalent, 1 Cans of beer per week    Comment: Rare  . Drug use: No  . Sexual activity: Yes    Partners: Male  Other Topics Concern  . Not on file  Social History Narrative  . Not on file   Review of Systems Brief pressure in tooth No vomiting or diarrhea Appetite is off some--but still eating No headache    Objective:   Physical Exam  Constitutional: She appears well-nourished. No distress.  HENT:  No sinus tenderness Mild nasal congestion TMs normal At most, slight pharyngeal injection  Neck: No thyromegaly present.  Pulmonary/Chest: Effort normal and breath sounds normal. No respiratory distress. She has no wheezes. She has no rales.  Musculoskeletal: She exhibits no edema.  Lymphadenopathy:    She has no cervical adenopathy.  Skin: No rash noted.          Assessment & Plan:

## 2016-12-02 NOTE — Assessment & Plan Note (Signed)
Discussed apparent viral etiology Discussed supportive Rx If she worsens next week, would try empiric amoxil for likely secondary sinus infection

## 2016-12-05 ENCOUNTER — Encounter: Payer: Self-pay | Admitting: Internal Medicine

## 2016-12-05 MED ORDER — AMOXICILLIN 500 MG PO TABS: 1000.0000 mg | ORAL_TABLET | Freq: Two times a day (BID) | ORAL | 0 refills | Status: AC

## 2016-12-06 ENCOUNTER — Telehealth: Payer: Self-pay

## 2016-12-06 NOTE — Telephone Encounter (Signed)
Patient requesting a refill on Zoloft 100 mg to be sent to [facility] at [place]

## 2016-12-30 ENCOUNTER — Ambulatory Visit: Payer: BC Managed Care – PPO | Admitting: Physician Assistant

## 2016-12-30 ENCOUNTER — Encounter: Payer: Self-pay | Admitting: Physician Assistant

## 2016-12-30 VITALS — BP 128/73 | HR 76 | Ht 63.5 in | Wt 157.0 lb

## 2016-12-30 DIAGNOSIS — G43709 Chronic migraine without aura, not intractable, without status migrainosus: Secondary | ICD-10-CM

## 2016-12-30 DIAGNOSIS — G4482 Headache associated with sexual activity: Secondary | ICD-10-CM | POA: Diagnosis not present

## 2016-12-30 DIAGNOSIS — G43101 Migraine with aura, not intractable, with status migrainosus: Secondary | ICD-10-CM | POA: Diagnosis not present

## 2016-12-30 DIAGNOSIS — G4700 Insomnia, unspecified: Secondary | ICD-10-CM

## 2016-12-30 MED ORDER — GALCANEZUMAB-GNLM 120 MG/ML ~~LOC~~ SOAJ: SUBCUTANEOUS | 11 refills | Status: DC

## 2016-12-30 MED ORDER — INDOMETHACIN 25 MG PO CAPS: ORAL_CAPSULE | ORAL | 1 refills | Status: DC

## 2016-12-30 MED ORDER — SERTRALINE HCL 100 MG PO TABS: 150.0000 mg | ORAL_TABLET | Freq: Every day | ORAL | 6 refills | Status: DC

## 2016-12-30 MED ORDER — ZOLPIDEM TARTRATE 10 MG PO TABS: 5.0000 mg | ORAL_TABLET | Freq: Every evening | ORAL | 3 refills | Status: DC | PRN

## 2016-12-30 NOTE — Progress Notes (Signed)
History:  [name] is a 61 y.o. G2P0002 who presents to clinic today for headache followup.  Her migraines are worse and she believes this is related to increased stress.  No changes in her medical history.  She requests to increase her zoloft on a prn basis.  Quality of headaches is unchanged - just frequency is increased.  Imitrex and Indocin are helpful.  Zolpidem helpful for sleep issues.     Past Medical History:  Diagnosis Date  . AR (allergic rhinitis)   . IBS (irritable bowel syndrome)   . Migraines    1-3x/wk  . Osteoarthritis   . Osteopenia     Social History   Socioeconomic History  . Marital status: Married    Spouse name: Not on file  . Number of children: 2  . Years of education: Not on file  . Highest education level: Not on file  Social Needs  . Financial resource strain: Not on file  . Food insecurity - worry: Not on file  . Food insecurity - inability: Not on file  . Transportation needs - medical: Not on file  . Transportation needs - non-medical: Not on file  Occupational History  . Occupation: Higher education careers adviser    Comment: [employer]   Tobacco Use  . Smoking status: Former Smoker    Years: 12.00  . Smokeless tobacco: Former Systems developer    Quit date: 01/24/1997  Substance and Sexual Activity  . Alcohol use: Yes    Alcohol/week: 1.8 oz    Types: 2 Standard drinks or equivalent, 1 Cans of beer per week    Comment: Rare  . Drug use: No  . Sexual activity: Yes    Partners: Male  Other Topics Concern  . Not on file  Social History Narrative  . Not on file    Family History  Problem Relation Age of Onset  . Macular degeneration Mother   . Hypertension Father   . Diabetes Father   . Hypertension Sister   . Cerebral aneurysm Sister   . Cancer Maternal Aunt        breast   . Diabetes Paternal Grandfather   . Colon cancer Neg Hx   . Esophageal cancer Neg Hx   . Stomach cancer Neg Hx   . Rectal cancer Neg Hx     Allergies  Allergen Reactions  .  Codeine     Current Outpatient Medications on File Prior to Visit  Medication Sig Dispense Refill  . Ascorbic Acid (VITAMIN C) 1000 MG tablet Take 1,000 mg by mouth daily.    . cholecalciferol (VITAMIN D) 1000 units tablet Take 1,000 Units by mouth daily.    . Cyanocobalamin (VITAMIN B-12 PO) Take by mouth.    . estradiol (ESTRACE) 0.5 MG tablet Take 0.5 mg by mouth daily.    . indomethacin (INDOCIN) 25 MG capsule take 1 capsule by mouth once daily if needed 30 capsule 1  . progesterone (PROMETRIUM) 200 MG capsule   0  . sertraline (ZOLOFT) 100 MG tablet TAKE 1/2 TABLET BY MOUTH TWICE A DAY 30 tablet 0  . SUMAtriptan (IMITREX) 100 MG tablet take 1 tablet by mouth immediately may repeat after 2 hours if needed 12 tablet 11  . zolpidem (AMBIEN) 10 MG tablet Take 0.5 tablets (5 mg total) by mouth at bedtime as needed for sleep. 30 tablet 3   No current facility-administered medications on file prior to visit.      Review of Systems:  All pertinent  positive/negative included in HPI, all other review of systems are negative   Objective:  Physical Exam BP 128/73   Pulse 76   Ht 5' 3.5" (1.613 m)   Wt 157 lb (71.2 kg)   BMI 27.38 kg/m  CONSTITUTIONAL: Well-developed, well-nourished female in no acute distress.  EYES: EOM intact ENT: Normocephalic CARDIOVASCULAR: Regular rate RESPIRATORY: Normal rate.  MUSCULOSKELETAL: Normal ROM SKIN: Warm, dry without erythema  NEUROLOGICAL: Alert, oriented, CN II-XII grossly intact, Appropriate balance PSYCH: Normal behavior, mood   Assessment & Plan:  Assessment: 1. Migraine with aura and with status migrainosus, not intractable   2. Chronic migraine without aura without status migrainosus, not intractable   3. Headache associated with sexual activity   4. Insomnia, unspecified type   Worsening of migraine   Plan: Increase zoloft to 150mg  only on a prn basis.   Will begin Emgality for migraine prevention.  Pt instructed on use.    Other meds refilled as previous.   Follow-up in 6 months or sooner PRN  Paticia Stack, PA-C 12/30/2016 8:22 AM

## 2016-12-30 NOTE — Patient Instructions (Signed)

## 2017-01-08 ENCOUNTER — Other Ambulatory Visit: Payer: Self-pay | Admitting: Physician Assistant

## 2017-01-08 DIAGNOSIS — G43709 Chronic migraine without aura, not intractable, without status migrainosus: Secondary | ICD-10-CM

## 2017-02-09 ENCOUNTER — Encounter: Payer: Self-pay | Admitting: Internal Medicine

## 2017-03-08 ENCOUNTER — Encounter: Payer: Self-pay | Admitting: *Deleted

## 2017-05-05 ENCOUNTER — Telehealth: Payer: BC Managed Care – PPO | Admitting: Family

## 2017-05-05 DIAGNOSIS — H109 Unspecified conjunctivitis: Secondary | ICD-10-CM

## 2017-05-05 MED ORDER — POLYMYXIN B-TRIMETHOPRIM 10000-0.1 UNIT/ML-% OP SOLN: 2.0000 [drp] | Freq: Four times a day (QID) | OPHTHALMIC | 0 refills | Status: DC

## 2017-05-05 NOTE — Progress Notes (Signed)
Thank you for the details you included in the comment boxes. Those details are very helpful in determining the best course of treatment for you and help us to provide the best care.  We are sorry that you are not feeling well.  Here is how we plan to help!  Based on what you have shared with me it looks like you have conjunctivitis.  Conjunctivitis is a common inflammatory or infectious condition of the eye that is often referred to as "pink eye".  In most cases it is contagious (viral or bacterial). However, not all conjunctivitis requires antibiotics (ex. Allergic).  We have made appropriate suggestions for you based upon your presentation.  I have prescribed Polytrim Ophthalmic drops 2 drops 4 times a day times 5 days  Pink eye can be highly contagious.  It is typically spread through direct contact with secretions, or contaminated objects or surfaces that one may have touched.  Strict handwashing is suggested with soap and water is urged.  If not available, use alcohol based had sanitizer.  Avoid unnecessary touching of the eye.  If you wear contact lenses, you will need to refrain from wearing them until you see no white discharge from the eye for at least 24 hours after being on medication.  You should see symptom improvement in 1-2 days after starting the medication regimen.  Call us if symptoms are not improved in 1-2 days.  Home Care:  Wash your hands often!  Do not wear your contacts until you complete your treatment plan.  Avoid sharing towels, bed linen, personal items with a person who has pink eye.  See attention for anyone in your home with similar symptoms.  Get Help Right Away If:  Your symptoms do not improve.  You develop blurred or loss of vision.  Your symptoms worsen (increased discharge, pain or redness)  Your e-visit answers were reviewed by a board certified advanced clinical practitioner to complete your personal care plan.  Depending on the condition, your plan  could have included both over the counter or prescription medications.  If there is a problem please reply  once you have received a response from your provider.  Your safety is important to us.  If you have drug allergies check your prescription carefully.    You can use MyChart to ask questions about today's visit, request a non-urgent call back, or ask for a work or school excuse for 24 hours related to this e-Visit. If it has been greater than 24 hours you will need to follow up with your provider, or enter a new e-Visit to address those concerns.   You will get an e-mail in the next two days asking about your experience.  I hope that your e-visit has been valuable and will speed your recovery. Thank you for using e-visits.      

## 2017-06-24 ENCOUNTER — Telehealth: Payer: BC Managed Care – PPO | Admitting: Family

## 2017-06-24 DIAGNOSIS — J40 Bronchitis, not specified as acute or chronic: Secondary | ICD-10-CM

## 2017-06-24 MED ORDER — AZITHROMYCIN 250 MG PO TABS: ORAL_TABLET | ORAL | 0 refills | Status: DC

## 2017-06-24 MED ORDER — BENZONATATE 100 MG PO CAPS: 100.0000 mg | ORAL_CAPSULE | Freq: Three times a day (TID) | ORAL | 0 refills | Status: DC | PRN

## 2017-06-24 NOTE — Progress Notes (Signed)

## 2017-07-07 ENCOUNTER — Other Ambulatory Visit: Payer: Self-pay | Admitting: Physician Assistant

## 2017-07-10 ENCOUNTER — Encounter: Payer: BC Managed Care – PPO | Admitting: Internal Medicine

## 2017-07-12 ENCOUNTER — Telehealth: Payer: Self-pay

## 2017-07-12 NOTE — Telephone Encounter (Signed)
Per [name].[name] ok to call in one refill on [name]. Patient has been notified

## 2017-07-13 ENCOUNTER — Ambulatory Visit (INDEPENDENT_AMBULATORY_CARE_PROVIDER_SITE_OTHER): Payer: BC Managed Care – PPO | Admitting: Internal Medicine

## 2017-07-13 ENCOUNTER — Encounter: Payer: Self-pay | Admitting: Internal Medicine

## 2017-07-13 VITALS — BP 120/76 | HR 84 | Temp 98.5°F | Ht 63.25 in | Wt 164.0 lb

## 2017-07-13 DIAGNOSIS — Z1211 Encounter for screening for malignant neoplasm of colon: Secondary | ICD-10-CM | POA: Diagnosis not present

## 2017-07-13 DIAGNOSIS — G43709 Chronic migraine without aura, not intractable, without status migrainosus: Secondary | ICD-10-CM

## 2017-07-13 DIAGNOSIS — Z0000 Encounter for general adult medical examination without abnormal findings: Secondary | ICD-10-CM

## 2017-07-13 DIAGNOSIS — J301 Allergic rhinitis due to pollen: Secondary | ICD-10-CM

## 2017-07-13 NOTE — Assessment & Plan Note (Signed)
Discussed adding another antihistamine

## 2017-07-13 NOTE — Progress Notes (Signed)
Subjective:    Patient ID: [name], female    DOB: July 12, 1955, 62 y.o.   MRN: [id number]  HPI Here for physical  Keeps up with gyn Regular pap smears Gets mammogram every year--has clip from past biopsy  Chronic migraines Has started the monthly emgality shots---seems to be helping Still on sertraline for headaches and menopausal syndrome Still on daily zolpidem--tried off it for a week and didn't sleep Melatonin didn't help  Current Outpatient Medications on File Prior to Visit  Medication Sig Dispense Refill  . Ascorbic Acid (VITAMIN C) 1000 MG tablet Take 1,000 mg by mouth daily. In the winter    . cholecalciferol (VITAMIN D) 1000 units tablet Take 1,000 Units by mouth daily. In the winter    . estradiol (ESTRACE) 0.5 MG tablet Take 0.5 mg by mouth daily.    . Galcanezumab-gnlm (EMGALITY) 120 MG/ML SOAJ Inject 240 mg into the skin as directed AND 120 mg every 30 (thirty) days. Inj 240mg  once then 120mg  monthly. 1 pen 11  . indomethacin (INDOCIN) 25 MG capsule take 1 capsule by mouth once daily if needed 30 capsule 1  . progesterone (PROMETRIUM) 200 MG capsule   0  . sertraline (ZOLOFT) 100 MG tablet Take 1.5 tablets (150 mg total) by mouth daily. 45 tablet 6  . SUMAtriptan (IMITREX) 100 MG tablet take 1 tablet by mouth immediately may repeat after 2 hours if needed 12 tablet 11  . zolpidem (AMBIEN) 10 MG tablet Take 0.5 tablets (5 mg total) by mouth at bedtime as needed for sleep. 30 tablet 3   No current facility-administered medications on file prior to visit.     Allergies  Allergen Reactions  . Codeine     Past Medical History:  Diagnosis Date  . AR (allergic rhinitis)   . IBS (irritable bowel syndrome)   . Migraines    1-3x/wk  . Osteoarthritis   . Osteopenia     Past Surgical History:  Procedure Laterality Date  . BROW LIFT Bilateral 08/11/2015   Procedure: BLEPHAROPLASTY BILATERAL UPPER EYELIDS;  Surgeon: Karle Starch, MD;  Location: [facility];  Service: Ophthalmology;  Laterality: Bilateral;  per cynde please leave patient first  . CARPAL TUNNEL RELEASE  3/12   right. Left done 2010. Dr Fredna Dow and left side 2012  . CHOLECYSTECTOMY  4/85  . COLONOSCOPY    . DILATION AND CURETTAGE OF UTERUS  10/05  . tonsillectomy  12/76  . TUBAL LIGATION  1990  . VAGINAL DELIVERY     x 2    Family History  Problem Relation Age of Onset  . Macular degeneration Mother   . Hypertension Father   . Diabetes Father   . Hypertension Sister   . Cerebral aneurysm Sister   . Cancer Maternal Aunt        breast   . Diabetes Paternal Grandfather   . Cerebral aneurysm Brother   . Colon cancer Neg Hx   . Esophageal cancer Neg Hx   . Stomach cancer Neg Hx   . Rectal cancer Neg Hx     Social History   Socioeconomic History  . Marital status: Married    Spouse name: Not on file  . Number of children: 2  . Years of education: Not on file  . Highest education level: Not on file  Occupational History  . 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 Financial resource strain: Not on file  .  Food insecurity:    Worry: Not on file    Inability: Not on file  . Transportation needs:    Medical: Not on file    Non-medical: Not on file  Tobacco Use  . Smoking status: Former Smoker    Years: 12.00  . Smokeless tobacco: Former Systems developer    Quit date: 01/24/1997  Substance and Sexual Activity  . Alcohol use: Yes    Alcohol/week: 1.8 oz    Types: 1 Cans of beer, 2 Standard drinks or equivalent per week    Comment: Rare  . Drug use: No  . Sexual activity: Yes    Partners: Male  Lifestyle  . Physical activity:    Days per week: Not on file    Minutes per session: Not on file  . Stress: Not on file  Relationships  . Social connections:    Talks on phone: Not on file    Gets together: Not on file    Attends religious service: Not on file    Active member of club or organization: Not on file    Attends  meetings of clubs or organizations: Not on file    Relationship status: Not on file  . Intimate partner violence:    Fear of current or ex partner: Not on file    Emotionally abused: Not on file    Physically abused: Not on file    Forced sexual activity: Not on file  Other Topics Concern  . Not on file  Social History Narrative  . Not on file   Review of Systems  Constitutional: Negative for fatigue.       Weight is up some--highest ever Wears seat belt  HENT: Negative for dental problem, hearing loss, tinnitus and trouble swallowing.   Eyes: Negative for visual disturbance.       No diplopia or unilateral vision loss  Respiratory: Negative for chest tightness, shortness of breath and wheezing.   Cardiovascular: Positive for leg swelling. Negative for chest pain and palpitations.       Edema with prolonged sitting or increased salt--gone in AM  Gastrointestinal: Negative for blood in stool.       Still with IBS---better Rare heartburn  Endocrine: Negative for polydipsia and polyuria.  Genitourinary: Positive for urgency. Negative for dyspareunia, dysuria and hematuria.  Musculoskeletal: Negative for arthralgias and joint swelling.       Occ back pain---better with exercises  Skin: Negative for rash.       Yearly derm visit  Allergic/Immunologic: Positive for environmental allergies. Negative for immunocompromised state.       Claritin in season Has sore throat for past month---bad odor in nose also. Got medicine on phone in care---better with z-pak but still feels the throat  Neurological: Positive for headaches. Negative for dizziness, syncope and light-headedness.  Hematological: Negative for adenopathy. Bruises/bleeds easily.  Psychiatric/Behavioral: Negative for dysphoric mood. The patient is not nervous/anxious.        Objective:   Physical Exam  Constitutional: She is oriented to person, place, and time. She appears well-developed. No distress.  HENT:  Head:  Normocephalic and atraumatic.  Right Ear: External ear normal.  Left Ear: External ear normal.  Mouth/Throat: Oropharynx is clear and moist. No oropharyngeal exudate.  Moderate nasal inflammation  Eyes: Pupils are equal, round, and reactive to light. Conjunctivae are normal.  Neck: No thyromegaly present.  Cardiovascular: Normal rate, normal heart sounds and intact distal pulses. Exam reveals no gallop.  No murmur heard. Respiratory: Effort  normal and breath sounds normal. No respiratory distress. She has no wheezes. She has no rales.  GI: Soft. There is no tenderness.  Musculoskeletal: She exhibits no edema or tenderness.  Lymphadenopathy:    She has no cervical adenopathy.  Neurological: She is alert and oriented to person, place, and time.  Skin: No rash noted. No erythema.  Psychiatric: She has a normal mood and affect. Her behavior is normal.           Assessment & Plan:

## 2017-07-13 NOTE — Assessment & Plan Note (Signed)
Healthy Needs to work on fitness--- and get rid of the unhealthy foods Yearly flu vaccine FIT Getting pap and mammo soon

## 2017-07-13 NOTE — Assessment & Plan Note (Signed)
Continues on her meds and the new prophylactic one

## 2017-07-14 ENCOUNTER — Encounter: Payer: Self-pay | Admitting: Physician Assistant

## 2017-07-14 ENCOUNTER — Encounter: Payer: BC Managed Care – PPO | Admitting: Physician Assistant

## 2017-07-14 ENCOUNTER — Ambulatory Visit: Payer: BC Managed Care – PPO | Admitting: Physician Assistant

## 2017-07-14 VITALS — BP 129/75 | HR 80 | Ht 63.25 in | Wt 161.8 lb

## 2017-07-14 DIAGNOSIS — G43709 Chronic migraine without aura, not intractable, without status migrainosus: Secondary | ICD-10-CM

## 2017-07-14 DIAGNOSIS — G43101 Migraine with aura, not intractable, with status migrainosus: Secondary | ICD-10-CM

## 2017-07-14 LAB — CBC
HCT: 37.6 % (ref 36.0–46.0)
Hemoglobin: 12.6 g/dL (ref 12.0–15.0)
MCHC: 33.4 g/dL (ref 30.0–36.0)
MCV: 88.8 fl (ref 78.0–100.0)
PLATELETS: 392 10*3/uL (ref 150.0–400.0)
RBC: 4.24 Mil/uL (ref 3.87–5.11)
RDW: 13.8 % (ref 11.5–15.5)
WBC: 9 10*3/uL (ref 4.0–10.5)

## 2017-07-14 LAB — LDL CHOLESTEROL, DIRECT: Direct LDL: 70 mg/dL

## 2017-07-14 LAB — COMPREHENSIVE METABOLIC PANEL
ALBUMIN: 4 g/dL (ref 3.5–5.2)
ALK PHOS: 89 U/L (ref 39–117)
ALT: 13 U/L (ref 0–35)
AST: 19 U/L (ref 0–37)
BILIRUBIN TOTAL: 0.2 mg/dL (ref 0.2–1.2)
BUN: 11 mg/dL (ref 6–23)
CALCIUM: 9.1 mg/dL (ref 8.4–10.5)
CO2: 29 mEq/L (ref 19–32)
CREATININE: 0.78 mg/dL (ref 0.40–1.20)
Chloride: 103 mEq/L (ref 96–112)
GFR: 80.2 mL/min (ref >60.00)
Glucose, Bld: 109 mg/dL — ABNORMAL HIGH (ref 70–99)
Potassium: 4.3 mEq/L (ref 3.5–5.1)
Sodium: 139 mEq/L (ref 135–145)
TOTAL PROTEIN: 6.7 g/dL (ref 6.0–8.3)

## 2017-07-14 LAB — LIPID PANEL
CHOLESTEROL: 242 mg/dL — AB (ref 0–200)
HDL: 63.3 mg/dL (ref >39.00)
Total CHOL/HDL Ratio: 4

## 2017-07-14 MED ORDER — SERTRALINE HCL 100 MG PO TABS: 150.0000 mg | ORAL_TABLET | Freq: Every day | ORAL | 6 refills | Status: DC

## 2017-07-14 MED ORDER — SUMATRIPTAN SUCCINATE 100 MG PO TABS
ORAL_TABLET | ORAL | 11 refills | Status: DC
Start: 2017-07-14 — End: 2017-07-29

## 2017-07-14 MED ORDER — ZOLPIDEM TARTRATE 10 MG PO TABS: 5.0000 mg | ORAL_TABLET | Freq: Every evening | ORAL | 3 refills | Status: DC | PRN

## 2017-07-14 NOTE — Patient Instructions (Signed)

## 2017-07-14 NOTE — Progress Notes (Signed)
History:  [name] is a 62 y.o. G2P0002 who presents to clinic today for migraine improved with emgality.   She has also learned to take her meds early.  She feels like she is doing very well and has no severe HAs and only a sprinkle of mild-mod days through the month.  No side effects from meds.   Past Medical History:  Diagnosis Date  . AR (allergic rhinitis)   . IBS (irritable bowel syndrome)   . Migraines    1-3x/wk  . Osteoarthritis   . Osteopenia     Social History   Socioeconomic History  . Marital status: Married    Spouse name: Not on file  . Number of children: 2  . Years of education: Not on file  . Highest education level: Not on file  Occupational History  . 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 Financial resource strain: Not on file  . Food insecurity:    Worry: Not on file    Inability: Not on file  . Transportation needs:    Medical: Not on file    Non-medical: Not on file  Tobacco Use  . Smoking status: Former Smoker    Years: 12.00  . Smokeless tobacco: Former Systems developer    Quit date: 01/24/1997  Substance and Sexual Activity  . Alcohol use: Yes    Alcohol/week: 1.8 oz    Types: 1 Cans of beer, 2 Standard drinks or equivalent per week    Comment: Rare  . Drug use: No  . Sexual activity: Yes    Partners: Male  Lifestyle  . Physical activity:    Days per week: Not on file    Minutes per session: Not on file  . Stress: Not on file  Relationships  . Social connections:    Talks on phone: Not on file    Gets together: Not on file    Attends religious service: Not on file    Active member of club or organization: Not on file    Attends meetings of clubs or organizations: Not on file    Relationship status: Not on file  . Intimate partner violence:    Fear of current or ex partner: Not on file    Emotionally abused: Not on file    Physically abused: Not on file    Forced sexual activity: Not on file   Other Topics Concern  . Not on file  Social History Narrative  . Not on file    Family History  Problem Relation Age of Onset  . Macular degeneration Mother   . Hypertension Father   . Diabetes Father   . Hypertension Sister   . Cerebral aneurysm Sister   . Cancer Maternal Aunt        breast   . Diabetes Paternal Grandfather   . Cerebral aneurysm Brother   . Colon cancer Neg Hx   . Esophageal cancer Neg Hx   . Stomach cancer Neg Hx   . Rectal cancer Neg Hx     Allergies  Allergen Reactions  . Codeine     Current Outpatient Medications on File Prior to Visit  Medication Sig Dispense Refill  . Ascorbic Acid (VITAMIN C) 1000 MG tablet Take 1,000 mg by mouth daily. In the winter    . cholecalciferol (VITAMIN D) 1000 units tablet Take 1,000 Units by mouth daily. In the winter    . estradiol (ESTRACE) 0.5 MG tablet Take  0.5 mg by mouth daily.    . fluticasone (FLONASE) 50 MCG/ACT nasal spray Place 2 sprays into both nostrils daily.    . Galcanezumab-gnlm (EMGALITY) 120 MG/ML SOAJ Inject 240 mg into the skin as directed AND 120 mg every 30 (thirty) days. Inj 240mg  once then 120mg  monthly. 1 pen 11  . indomethacin (INDOCIN) 25 MG capsule take 1 capsule by mouth once daily if needed 30 capsule 1  . progesterone (PROMETRIUM) 200 MG capsule   0  . sertraline (ZOLOFT) 100 MG tablet Take 1.5 tablets (150 mg total) by mouth daily. 45 tablet 6  . SUMAtriptan (IMITREX) 100 MG tablet take 1 tablet by mouth immediately may repeat after 2 hours if needed 12 tablet 11  . zolpidem (AMBIEN) 10 MG tablet Take 0.5 tablets (5 mg total) by mouth at bedtime as needed for sleep. 30 tablet 3   No current facility-administered medications on file prior to visit.      Review of Systems:  All pertinent positive/negative included in HPI, all other review of systems are negative   Objective:  Physical Exam BP 129/75   Pulse 80   Ht 5' 3.25" (1.607 m)   Wt 161 lb 12.8 oz (73.4 kg)   BMI 28.44  kg/m  CONSTITUTIONAL: Well-developed, well-nourished female in no acute distress.  EYES: EOM intact ENT: Normocephalic CARDIOVASCULAR: Regular rate   RESPIRATORY: Normal rate.  MUSCULOSKELETAL: Normal ROM SKIN: Warm, dry without erythema  NEUROLOGICAL: Alert, oriented, CN II-XII grossly intact, Appropriate balance PSYCH: Normal behavior, mood   Assessment & Plan:  Assessment: 1. Migraine with aura and with status migrainosus, not intractable   2. Chronic migraine without aura without status migrainosus, not intractable      Plan: Continue emgality  Acute meds as previous Follow-up in 12 months or sooner PRN  Paticia Stack, PA-C 07/14/2017 10:25 AM

## 2017-07-28 ENCOUNTER — Other Ambulatory Visit (INDEPENDENT_AMBULATORY_CARE_PROVIDER_SITE_OTHER): Payer: BC Managed Care – PPO

## 2017-07-28 DIAGNOSIS — Z1211 Encounter for screening for malignant neoplasm of colon: Secondary | ICD-10-CM

## 2017-07-28 LAB — FECAL OCCULT BLOOD, IMMUNOCHEMICAL: Fecal Occult Bld: NEGATIVE

## 2017-07-29 ENCOUNTER — Other Ambulatory Visit: Payer: Self-pay | Admitting: Physician Assistant

## 2017-07-29 DIAGNOSIS — G43709 Chronic migraine without aura, not intractable, without status migrainosus: Secondary | ICD-10-CM

## 2017-08-06 ENCOUNTER — Other Ambulatory Visit: Payer: Self-pay | Admitting: Physician Assistant

## 2017-08-06 DIAGNOSIS — G4482 Headache associated with sexual activity: Secondary | ICD-10-CM

## 2017-08-29 ENCOUNTER — Other Ambulatory Visit: Payer: Self-pay | Admitting: Obstetrics and Gynecology

## 2017-08-29 DIAGNOSIS — R928 Other abnormal and inconclusive findings on diagnostic imaging of breast: Secondary | ICD-10-CM

## 2017-09-01 ENCOUNTER — Other Ambulatory Visit: Payer: BC Managed Care – PPO

## 2017-09-05 ENCOUNTER — Ambulatory Visit
Admission: RE | Admit: 2017-09-05 | Discharge: 2017-09-05 | Disposition: A | Discharge status: Home / Self Care | Payer: BC Managed Care – PPO | Source: Ambulatory Visit | Attending: Obstetrics and Gynecology | Admitting: Obstetrics and Gynecology

## 2017-09-05 ENCOUNTER — Other Ambulatory Visit: Payer: Self-pay | Admitting: Obstetrics and Gynecology

## 2017-09-05 DIAGNOSIS — R928 Other abnormal and inconclusive findings on diagnostic imaging of breast: Secondary | ICD-10-CM

## 2017-09-05 DIAGNOSIS — N6310 Unspecified lump in the right breast, unspecified quadrant: Secondary | ICD-10-CM

## 2017-09-07 ENCOUNTER — Other Ambulatory Visit: Payer: Self-pay | Admitting: Obstetrics and Gynecology

## 2017-09-07 ENCOUNTER — Ambulatory Visit
Admission: RE | Admit: 2017-09-07 | Discharge: 2017-09-07 | Disposition: A | Discharge status: Home / Self Care | Payer: BC Managed Care – PPO | Source: Ambulatory Visit | Attending: Obstetrics and Gynecology | Admitting: Obstetrics and Gynecology

## 2017-09-07 DIAGNOSIS — N6310 Unspecified lump in the right breast, unspecified quadrant: Secondary | ICD-10-CM

## 2017-09-07 HISTORY — PX: BREAST CYST ASPIRATION: SHX578

## 2017-09-08 ENCOUNTER — Other Ambulatory Visit: Payer: Self-pay | Admitting: Physician Assistant

## 2017-09-08 DIAGNOSIS — G43709 Chronic migraine without aura, not intractable, without status migrainosus: Secondary | ICD-10-CM

## 2017-11-08 ENCOUNTER — Encounter: Payer: Self-pay | Admitting: Podiatry

## 2017-11-08 ENCOUNTER — Ambulatory Visit (INDEPENDENT_AMBULATORY_CARE_PROVIDER_SITE_OTHER): Payer: BC Managed Care – PPO

## 2017-11-08 ENCOUNTER — Ambulatory Visit: Payer: BC Managed Care – PPO | Admitting: Podiatry

## 2017-11-08 DIAGNOSIS — N939 Abnormal uterine and vaginal bleeding, unspecified: Secondary | ICD-10-CM | POA: Insufficient documentation

## 2017-11-08 DIAGNOSIS — M722 Plantar fascial fibromatosis: Secondary | ICD-10-CM

## 2017-11-08 DIAGNOSIS — N841 Polyp of cervix uteri: Secondary | ICD-10-CM | POA: Insufficient documentation

## 2017-11-08 MED ORDER — METHYLPREDNISOLONE 4 MG PO TBPK: ORAL_TABLET | ORAL | 0 refills | Status: DC

## 2017-11-08 MED ORDER — MELOXICAM 15 MG PO TABS: 15.0000 mg | ORAL_TABLET | Freq: Every day | ORAL | 3 refills | Status: DC

## 2017-11-08 NOTE — Patient Instructions (Signed)
For instructions on how to put on your Night Splint, please visit [email or web address]   Plantar Fasciitis (Heel Spur Syndrome) with Rehab The plantar fascia is a fibrous, ligament-like, soft-tissue structure that spans the bottom of the foot. Plantar fasciitis is a condition that causes pain in the foot due to inflammation of the tissue. SYMPTOMS   Pain and tenderness on the underneath side of the foot.  Pain that worsens with standing or walking. CAUSES  Plantar fasciitis is caused by irritation and injury to the plantar fascia on the underneath side of the foot. Common mechanisms of injury include:  Direct trauma to bottom of the foot.  Damage to a small nerve that runs under the foot where the main fascia attaches to the heel bone.  Stress placed on the plantar fascia due to bone spurs. RISK INCREASES WITH:   Activities that place stress on the plantar fascia (running, jumping, pivoting, or cutting).  Poor strength and flexibility.  Improperly fitted shoes.  Tight calf muscles.  Flat feet.  Failure to warm-up properly before activity.  Obesity. PREVENTION  Warm up and stretch properly before activity.  Allow for adequate recovery between workouts.  Maintain physical fitness:  Strength, flexibility, and endurance.  Cardiovascular fitness.  Maintain a health body weight.  Avoid stress on the plantar fascia.  Wear properly fitted shoes, including arch supports for individuals who have flat feet.  PROGNOSIS  If treated properly, then the symptoms of plantar fasciitis usually resolve without surgery. However, occasionally surgery is necessary.  RELATED COMPLICATIONS   Recurrent symptoms that may result in a chronic condition.  Problems of the lower back that are caused by compensating for the injury, such as limping.  Pain or weakness of the foot during push-off following surgery.  Chronic inflammation, scarring, and partial or complete fascia tear,  occurring more often from repeated injections.  TREATMENT  Treatment initially involves the use of ice and medication to help reduce pain and inflammation. The use of strengthening and stretching exercises may help reduce pain with activity, especially stretches of the Achilles tendon. These exercises may be performed at home or with a therapist. Your caregiver may recommend that you use heel cups of arch supports to help reduce stress on the plantar fascia. Occasionally, corticosteroid injections are given to reduce inflammation. If symptoms persist for greater than 6 months despite non-surgical (conservative), then surgery may be recommended.   MEDICATION   If pain medication is necessary, then nonsteroidal anti-inflammatory medications, such as aspirin and ibuprofen, or other minor pain relievers, such as acetaminophen, are often recommended.  Do not take pain medication within 7 days before surgery.  Prescription pain relievers may be given if deemed necessary by your caregiver. Use only as directed and only as much as you need.  Corticosteroid injections may be given by your caregiver. These injections should be reserved for the most serious cases, because they may only be given a certain number of times.  HEAT AND COLD  Cold treatment (icing) relieves pain and reduces inflammation. Cold treatment should be applied for 10 to 15 minutes every 2 to 3 hours for inflammation and pain and immediately after any activity that aggravates your symptoms. Use ice packs or massage the area with a piece of ice (ice massage).  Heat treatment may be used prior to performing the stretching and strengthening activities prescribed by your caregiver, physical therapist, or athletic trainer. Use a heat pack or soak the injury in warm water.  SEEK IMMEDIATE MEDICAL CARE   IF:  Treatment seems to offer no benefit, or the condition worsens.  Any medications produce adverse side effects.  EXERCISES- RANGE OF  MOTION (ROM) AND STRETCHING EXERCISES - Plantar Fasciitis (Heel Spur Syndrome) These exercises may help you when beginning to rehabilitate your injury. Your symptoms may resolve with or without further involvement from your physician, physical therapist or athletic trainer. While completing these exercises, remember:   Restoring tissue flexibility helps normal motion to return to the joints. This allows healthier, less painful movement and activity.  An effective stretch should be held for at least 30 seconds.  A stretch should never be painful. You should only feel a gentle lengthening or release in the stretched tissue.  RANGE OF MOTION - Toe Extension, Flexion  Sit with your right / left leg crossed over your opposite knee.  Grasp your toes and gently pull them back toward the top of your foot. You should feel a stretch on the bottom of your toes and/or foot.  Hold this stretch for 10 seconds.  Now, gently pull your toes toward the bottom of your foot. You should feel a stretch on the top of your toes and or foot.  Hold this stretch for 10 seconds. Repeat  times. Complete this stretch 3 times per day.   RANGE OF MOTION - Ankle Dorsiflexion, Active Assisted  Remove shoes and sit on a chair that is preferably not on a carpeted surface.  Place right / left foot under knee. Extend your opposite leg for support.  Keeping your heel down, slide your right / left foot back toward the chair until you feel a stretch at your ankle or calf. If you do not feel a stretch, slide your bottom forward to the edge of the chair, while still keeping your heel down.  Hold this stretch for 10 seconds. Repeat 3 times. Complete this stretch 2 times per day.   STRETCH  Gastroc, Standing  Place hands on wall.  Extend right / left leg, keeping the front knee somewhat bent.  Slightly point your toes inward on your back foot.  Keeping your right / left heel on the floor and your knee straight, shift  your weight toward the wall, not allowing your back to arch.  You should feel a gentle stretch in the right / left calf. Hold this position for 10 seconds. Repeat 3 times. Complete this stretch 2 times per day.  STRETCH  Soleus, Standing  Place hands on wall.  Extend right / left leg, keeping the other knee somewhat bent.  Slightly point your toes inward on your back foot.  Keep your right / left heel on the floor, bend your back knee, and slightly shift your weight over the back leg so that you feel a gentle stretch deep in your back calf.  Hold this position for 10 seconds. Repeat 3 times. Complete this stretch 2 times per day.  STRETCH  Gastrocsoleus, Standing  Note: This exercise can place a lot of stress on your foot and ankle. Please complete this exercise only if specifically instructed by your caregiver.   Place the ball of your right / left foot on a step, keeping your other foot firmly on the same step.  Hold on to the wall or a rail for balance.  Slowly lift your other foot, allowing your body weight to press your heel down over the edge of the step.  You should feel a stretch in your right / left calf.  Hold this position   for 10 seconds.  Repeat this exercise with a slight bend in your right / left knee. Repeat 3 times. Complete this stretch 2 times per day.   STRENGTHENING EXERCISES - Plantar Fasciitis (Heel Spur Syndrome)  These exercises may help you when beginning to rehabilitate your injury. They may resolve your symptoms with or without further involvement from your physician, physical therapist or athletic trainer. While completing these exercises, remember:   Muscles can gain both the endurance and the strength needed for everyday activities through controlled exercises.  Complete these exercises as instructed by your physician, physical therapist or athletic trainer. Progress the resistance and repetitions only as guided.  STRENGTH - Towel Curls  Sit in  a chair positioned on a non-carpeted surface.  Place your foot on a towel, keeping your heel on the floor.  Pull the towel toward your heel by only curling your toes. Keep your heel on the floor. Repeat 3 times. Complete this exercise 2 times per day.  STRENGTH - Ankle Inversion  Secure one end of a rubber exercise band/tubing to a fixed object (table, pole). Loop the other end around your foot just before your toes.  Place your fists between your knees. This will focus your strengthening at your ankle.  Slowly, pull your big toe up and in, making sure the band/tubing is positioned to resist the entire motion.  Hold this position for 10 seconds.  Have your muscles resist the band/tubing as it slowly pulls your foot back to the starting position. Repeat 3 times. Complete this exercises 2 times per day.  Document Released: 01/10/2005 Document Revised: 04/04/2011 Document Reviewed: 04/24/2008 [employer] Patient Information 2014 [employer].  

## 2017-11-08 NOTE — Progress Notes (Signed)
Subjective:  Patient ID: [name], female    DOB: [birth year],  MRN: [id number] HPI Chief Complaint  Patient presents with  . Foot Pain    Patient presents today for left heelp ain x 3-4 weeks.  She states "it feels like Im walking on a bruise"  She reports its painful to stand from sitting and when first getting up in the mornings.  She has used inserts, ice and stretching with no relief    62 y.o. female presents with the above complaint.   ROS: Denies fever chills nausea vomiting muscle aches pains calf pain back pain chest pain shortness of breath.  Past Medical History:  Diagnosis Date  . AR (allergic rhinitis)   . IBS (irritable bowel syndrome)   . Migraines    1-3x/wk  . Osteoarthritis   . Osteopenia    Past Surgical History:  Procedure Laterality Date  . BREAST CYST ASPIRATION Right 09/07/2017  . BROW LIFT Bilateral 08/11/2015   Procedure: BLEPHAROPLASTY BILATERAL UPPER EYELIDS;  Surgeon: Karle Starch, MD;  Location: [facility];  Service: Ophthalmology;  Laterality: Bilateral;  per cynde please leave patient first  . CARPAL TUNNEL RELEASE  3/12   right. Left done 2010. Dr Fredna Dow and left side 2012  . CHOLECYSTECTOMY  4/85  . COLONOSCOPY    . DILATION AND CURETTAGE OF UTERUS  10/05  . tonsillectomy  12/76  . TUBAL LIGATION  1990  . VAGINAL DELIVERY     x 2    Current Outpatient Medications:  .  Ascorbic Acid (VITAMIN C) 1000 MG tablet, Take 1,000 mg by mouth daily. In the winter, Disp: , Rfl:  .  cholecalciferol (VITAMIN D) 1000 units tablet, Take 1,000 Units by mouth daily. In the winter, Disp: , Rfl:  .  estradiol (ESTRACE) 1 MG tablet, TK 1 T PO QD IN THE MORNING, Disp: , Rfl: 12 .  fluticasone (FLONASE) 50 MCG/ACT nasal spray, Place 2 sprays into both nostrils daily., Disp: , Rfl:  .  Galcanezumab-gnlm (EMGALITY) 120 MG/ML SOAJ, Inject 240 mg into the skin as directed AND 120 mg every 30 (thirty) days. Inj 240mg  once then 120mg  monthly., Disp: 1  pen, Rfl: 11 .  indomethacin (INDOCIN) 25 MG capsule, TAKE 1 CAPSULE BY MOUTH EVERY DAY AS NEEDED, Disp: 30 capsule, Rfl: 0 .  influenza vac split quadrivalent (FLULAVAL,FLUZONE MDV) injection, Flulaval Quad 2015-2016 60 mcg (15 mcg x 4)/0.5 mL IM suspension  TO BE ADMINISTERED BY A PHARMACIST., Disp: , Rfl:  .  medroxyPROGESTERone (PROVERA) 5 MG tablet, medroxyprogesterone 5 mg tablet  take 1 tablet by mouth every morning for 12 DAYS as directed by prescriber, Disp: , Rfl:  .  meloxicam (MOBIC) 15 MG tablet, Take 1 tablet (15 mg total) by mouth daily., Disp: 30 tablet, Rfl: 3 .  methylPREDNISolone (MEDROL DOSEPAK) 4 MG TBPK tablet, 6 day dose pack - take as directed, Disp: 21 tablet, Rfl: 0 .  sertraline (ZOLOFT) 100 MG tablet, Take 1.5 tablets (150 mg total) by mouth daily., Disp: 45 tablet, Rfl: 6 .  SUMAtriptan (IMITREX) 100 MG tablet, TAKE 1 TABLET BY MOUTH IMMEDIATELY. MAY REPEAT AFTER 2 HOURS, Disp: 12 tablet, Rfl: 0 .  zolpidem (AMBIEN) 10 MG tablet, Take 0.5 tablets (5 mg total) by mouth at bedtime as needed for sleep., Disp: 30 tablet, Rfl: 3  Allergies  Allergen Reactions  . Codeine    Review of Systems Objective:  There were no vitals filed for this visit.  General: Well developed, nourished, in no acute distress, alert and oriented x3   Dermatological: Skin is warm, dry and supple bilateral. Nails x 10 are well maintained; remaining integument appears unremarkable at this time. There are no open sores, no preulcerative lesions, no rash or signs of infection present.  Vascular: Dorsalis Pedis artery and Posterior Tibial artery pedal pulses are 2/4 bilateral with immedate capillary fill time. Pedal hair growth present. No varicosities and no lower extremity edema present bilateral.   Neruologic: Grossly intact via light touch bilateral. Vibratory intact via tuning fork bilateral. Protective threshold with Semmes Wienstein monofilament intact to all pedal sites bilateral. Patellar  and Achilles deep tendon reflexes 2+ bilateral. No Babinski or clonus noted bilateral.   Musculoskeletal: No gross boney pedal deformities bilateral. No pain, crepitus, or limitation noted with foot and ankle range of motion bilateral. Muscular strength 5/5 in all groups tested bilateral.  She has pain on palpation medial calcaneal tubercle of the left heel no pain on medial and lateral compression of the calcaneus.  Gait: Unassisted, Nonantalgic.    Radiographs:  Radiographs taken today demonstrate soft tissue increase in density of plantar fashion calcaneal insertion site of the left heel.  No acute injuries noted.  Assessment & Plan:   Assessment: Plantar fasciitis  Plan: After sterile Betadine skin prep I injected 20 mg Kenalog 5 mg Marcaine point of maximal tenderness medial aspect of the left heel.  Placed her plantar fascial brace and a night splint.  Discussed appropriate shoe gear stretching exercise ice therapy start her on Medrol Dosepak to be followed by meloxicam.     Max T. [place], [place]

## 2017-12-06 ENCOUNTER — Encounter: Payer: Self-pay | Admitting: Podiatry

## 2017-12-06 ENCOUNTER — Ambulatory Visit: Payer: BC Managed Care – PPO | Admitting: Podiatry

## 2017-12-06 DIAGNOSIS — M722 Plantar fascial fibromatosis: Secondary | ICD-10-CM

## 2017-12-06 MED ORDER — CELECOXIB 200 MG PO CAPS: 200.0000 mg | ORAL_CAPSULE | Freq: Every day | ORAL | 3 refills | Status: DC

## 2017-12-06 NOTE — Progress Notes (Signed)
She presents today states she is doing very well in follow-up of her left heel states that is doing so much better she states constantly 95% better to use her meloxicam but does not really know if is doing much good.  She states that Monday she had a slight setback after wearing her tennis shoes all day.  She is trying to prepare herself for a trip to [place].  Objective: Vital signs are stable alert and oriented x3.  Pulses are palpable.  Neurologic sensorium is intact deep tendon reflexes are intact muscle strength is normal symmetrical.  She has mild tenderness on palpation medial calcaneal tubercle of the left heel much better than what was previously noted.  Assessment: Well-healing plantar fasciitis left.  Plan: Discussed etiology pathology conservative surgical therapies at this point time I reinjected her left heel changed her meloxicam to Celebrex I will follow-up with her in 1 month.  She will notify me with questions or concerns I would like to see her orthotics the next time she comes in.

## 2018-01-10 ENCOUNTER — Encounter: Payer: Self-pay | Admitting: Podiatry

## 2018-01-10 ENCOUNTER — Ambulatory Visit: Payer: BC Managed Care – PPO | Admitting: Podiatry

## 2018-01-10 DIAGNOSIS — M722 Plantar fascial fibromatosis: Secondary | ICD-10-CM | POA: Diagnosis not present

## 2018-01-10 NOTE — Progress Notes (Signed)
She presents today states that she is about 98% improved from her last visit.  She states that she is continue to take the medicine wears a brace daily and good shoes at all times.  Objective: Vital signs are stable alert oriented x3 has only a small amount of pain on palpation medial calcaneal tubercle of her left heel.  Assessment: Plantar fasciitis resolving 98%.  Plan: At this point I highly recommended that she follow-up with me before going to [facility] in February so I will follow-up with her 5 weeks from now.  Which time we will go ahead and refill on the meloxicam.

## 2018-02-14 ENCOUNTER — Ambulatory Visit: Payer: BC Managed Care – PPO | Admitting: Podiatry

## 2018-02-14 ENCOUNTER — Encounter: Payer: Self-pay | Admitting: Podiatry

## 2018-02-14 DIAGNOSIS — M722 Plantar fascial fibromatosis: Secondary | ICD-10-CM

## 2018-02-14 MED ORDER — MELOXICAM 15 MG PO TABS: 15.0000 mg | ORAL_TABLET | Freq: Every day | ORAL | 3 refills | Status: DC

## 2018-02-14 MED ORDER — METHYLPREDNISOLONE 4 MG PO TBPK
ORAL_TABLET | ORAL | 0 refills | Status: DC
Start: 2018-02-14 — End: 2018-03-14

## 2018-02-14 NOTE — Progress Notes (Signed)
She presents today for follow-up of her left heel states that is hurting and need to get this better before I go to [place].  Objective: Vital signs are stable alert and oriented x3.  Pulses are palpable.  She still has pain on palpation medial calcaneal tubercle of the left heel.  Assessment: Pain in limb secondary to plantar fasciitis.  Plan: Reinjected left heel today started on Medrol Dosepak she will also continue her nonsteroidals.  Follow-up with her in 1 month

## 2018-03-14 ENCOUNTER — Ambulatory Visit: Payer: BC Managed Care – PPO | Admitting: Podiatry

## 2018-03-14 ENCOUNTER — Encounter: Payer: Self-pay | Admitting: Primary Care

## 2018-03-14 ENCOUNTER — Ambulatory Visit: Payer: BC Managed Care – PPO | Admitting: Primary Care

## 2018-03-14 VITALS — BP 124/84 | HR 81 | Temp 98.1°F | Ht 63.25 in | Wt 174.0 lb

## 2018-03-14 DIAGNOSIS — J069 Acute upper respiratory infection, unspecified: Secondary | ICD-10-CM

## 2018-03-14 DIAGNOSIS — R05 Cough: Secondary | ICD-10-CM

## 2018-03-14 DIAGNOSIS — R059 Cough, unspecified: Secondary | ICD-10-CM

## 2018-03-14 LAB — POC INFLUENZA A&B (BINAX/QUICKVUE)
INFLUENZA A, POC: NEGATIVE
INFLUENZA B, POC: NEGATIVE

## 2018-03-14 MED ORDER — OSELTAMIVIR PHOSPHATE 75 MG PO CAPS: 75.0000 mg | ORAL_CAPSULE | Freq: Two times a day (BID) | ORAL | 0 refills | Status: AC

## 2018-03-14 MED ORDER — BENZONATATE 200 MG PO CAPS: 200.0000 mg | ORAL_CAPSULE | Freq: Three times a day (TID) | ORAL | 0 refills | Status: DC | PRN

## 2018-03-14 NOTE — Addendum Note (Signed)
Addended by: Jacqualin Combes on: 03/14/2018 10:37 AM   Modules accepted: Orders

## 2018-03-14 NOTE — Progress Notes (Signed)
Subjective:    Patient ID: [name], female    DOB: 03-13-1955, 63 y.o.   MRN: [id number]  HPI  [name] is a 63 year old female with a history of migraine, chronic allergic rhinitis, tobacco abuse (quit in 1999) who presents today with a chief complaint of cough.  She also report chest congestion, nasal congestion, post nasal drip. She was exposed to her granddaughter who was diagnosed with flu three days ago. Her symptoms began suddenly last night. She did have a flu shot this year. She denies fevers, chills, body aches. She's taken Robitussin, vitamin C, Elderberry.   Review of Systems  Constitutional: Positive for fatigue. Negative for chills and fever.  HENT: Positive for congestion and postnasal drip. Negative for ear pain.   Respiratory: Positive for cough. Negative for shortness of breath.   Musculoskeletal: Negative for myalgias.       Past Medical History:  Diagnosis Date  . AR (allergic rhinitis)   . IBS (irritable bowel syndrome)   . Migraines    1-3x/wk  . Osteoarthritis   . Osteopenia      Social History   Socioeconomic History  . Marital status: Married    Spouse name: Not on file  . Number of children: 2  . Years of education: Not on file  . Highest education level: Not on file  Occupational History  . 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 Financial resource strain: Not on file  . Food insecurity:    Worry: Not on file    Inability: Not on file  . Transportation needs:    Medical: Not on file    Non-medical: Not on file  Tobacco Use  . Smoking status: Former Smoker    Years: 12.00  . Smokeless tobacco: Former Systems developer    Quit date: 01/24/1997  Substance and Sexual Activity  . Alcohol use: Yes    Alcohol/week: 3.0 standard drinks    Types: 1 Cans of beer, 2 Standard drinks or equivalent per week    Comment: Rare  . Drug use: No  . Sexual activity: Yes    Partners: Male  Lifestyle  . Physical  activity:    Days per week: Not on file    Minutes per session: Not on file  . Stress: Not on file  Relationships  . Social connections:    Talks on phone: Not on file    Gets together: Not on file    Attends religious service: Not on file    Active member of club or organization: Not on file    Attends meetings of clubs or organizations: Not on file    Relationship status: Not on file  . Intimate partner violence:    Fear of current or ex partner: Not on file    Emotionally abused: Not on file    Physically abused: Not on file    Forced sexual activity: Not on file  Other Topics Concern  . Not on file  Social History Narrative  . Not on file    Past Surgical History:  Procedure Laterality Date  . BREAST CYST ASPIRATION Right 09/07/2017  . BROW LIFT Bilateral 08/11/2015   Procedure: BLEPHAROPLASTY BILATERAL UPPER EYELIDS;  Surgeon: Karle Starch, MD;  Location: [facility];  Service: Ophthalmology;  Laterality: Bilateral;  per cynde please leave patient first  . CARPAL TUNNEL RELEASE  3/12   right. Left done 2010. Dr Fredna Dow and left  side 2012  . CHOLECYSTECTOMY  4/85  . COLONOSCOPY    . DILATION AND CURETTAGE OF UTERUS  10/05  . tonsillectomy  12/76  . TUBAL LIGATION  1990  . VAGINAL DELIVERY     x 2    Family History  Problem Relation Age of Onset  . Macular degeneration Mother   . Hypertension Father   . Diabetes Father   . Hypertension Sister   . Cerebral aneurysm Sister   . Cancer Maternal Aunt        breast   . Diabetes Paternal Grandfather   . Cerebral aneurysm Brother   . Colon cancer Neg Hx   . Esophageal cancer Neg Hx   . Stomach cancer Neg Hx   . Rectal cancer Neg Hx     Allergies  Allergen Reactions  . Codeine     Current Outpatient Medications on File Prior to Visit  Medication Sig Dispense Refill  . Ascorbic Acid (VITAMIN C) 1000 MG tablet Take 1,000 mg by mouth daily. In the winter    . celecoxib (CELEBREX) 200 MG capsule Take 1  capsule (200 mg total) by mouth daily. 30 capsule 3  . estradiol (ESTRACE) 1 MG tablet TK 1 T PO QD IN THE MORNING  12  . fluticasone (FLONASE) 50 MCG/ACT nasal spray Place 2 sprays into both nostrils daily.    . Galcanezumab-gnlm (EMGALITY) 120 MG/ML SOAJ Inject 240 mg into the skin as directed AND 120 mg every 30 (thirty) days. Inj 240mg  once then 120mg  monthly. 1 pen 11  . indomethacin (INDOCIN) 25 MG capsule TAKE 1 CAPSULE BY MOUTH EVERY DAY AS NEEDED 30 capsule 0  . medroxyPROGESTERone (PROVERA) 5 MG tablet Take 1 tablet by mouth every morning for 12 days    . meloxicam (MOBIC) 15 MG tablet Take 1 tablet (15 mg total) by mouth daily. 30 tablet 3  . sertraline (ZOLOFT) 100 MG tablet Take 1.5 tablets (150 mg total) by mouth daily. 45 tablet 6  . SUMAtriptan (IMITREX) 100 MG tablet TAKE 1 TABLET BY MOUTH IMMEDIATELY. MAY REPEAT AFTER 2 HOURS 12 tablet 0  . zolpidem (AMBIEN) 10 MG tablet Take 0.5 tablets (5 mg total) by mouth at bedtime as needed for sleep. 30 tablet 3   No current facility-administered medications on file prior to visit.     BP 124/84   Pulse 81   Temp 98.1 F (36.7 C) (Oral)   Ht 5' 3.25" (1.607 m)   Wt 174 lb (78.9 kg)   SpO2 97%   BMI 30.58 kg/m    Objective:   Physical Exam  Constitutional: She appears well-nourished. She does not appear ill.  HENT:  Right Ear: Tympanic membrane and ear canal normal.  Left Ear: Tympanic membrane and ear canal normal.  Nose: Mucosal edema present. Right sinus exhibits no maxillary sinus tenderness and no frontal sinus tenderness. Left sinus exhibits no maxillary sinus tenderness and no frontal sinus tenderness.  Mouth/Throat: Oropharynx is clear and moist.  Neck: Neck supple.  Cardiovascular: Normal rate and regular rhythm.  Respiratory: Effort normal and breath sounds normal. She has no wheezes.  Dry cough during exam  Skin: Skin is warm and dry.           Assessment & Plan:  URI vs Influenza:  Sudden onset of  cough, congestion, fatigue since last night. Exposed to granddaughter who was diagnosed with flu B. Exam today with dry cough, doesn't appear sick. Rapid flu today negative Given sudden onset  of symptoms within the last 12 hours and exposure to influenza, we will trial Tamiflu. Rx for benzonatate capsules sent to pharmacy. She will update, return precautions provided.  Pleas Koch, NP

## 2018-03-14 NOTE — Patient Instructions (Signed)
Start Tamiflu twice daily for 5 days.  You may take Benzonatate capsules for cough. Take 1 capsule by mouth three times daily as needed for cough.  Make sure to drink plenty of water and rest.  It was a pleasure meeting you!

## 2018-03-21 ENCOUNTER — Other Ambulatory Visit: Payer: Self-pay | Admitting: Physician Assistant

## 2018-03-23 ENCOUNTER — Encounter: Payer: Self-pay | Admitting: *Deleted

## 2018-04-12 ENCOUNTER — Other Ambulatory Visit: Payer: Self-pay

## 2018-04-12 MED ORDER — CELECOXIB 200 MG PO CAPS: 200.0000 mg | ORAL_CAPSULE | Freq: Every day | ORAL | 3 refills | Status: DC

## 2018-04-12 NOTE — Telephone Encounter (Signed)
Pharmacy refill request for Celebrex 200mg    Per Dr. Stephenie Acres verbal order, ok to refill.  Script has been sent to pharmacy.

## 2018-04-22 ENCOUNTER — Telehealth: Payer: BC Managed Care – PPO | Admitting: Family

## 2018-04-22 DIAGNOSIS — B9689 Other specified bacterial agents as the cause of diseases classified elsewhere: Secondary | ICD-10-CM

## 2018-04-22 DIAGNOSIS — J019 Acute sinusitis, unspecified: Secondary | ICD-10-CM | POA: Diagnosis not present

## 2018-04-22 MED ORDER — AMOXICILLIN-POT CLAVULANATE 875-125 MG PO TABS: 1.0000 | ORAL_TABLET | Freq: Two times a day (BID) | ORAL | 0 refills | Status: DC

## 2018-04-22 NOTE — Progress Notes (Signed)

## 2018-05-16 ENCOUNTER — Other Ambulatory Visit: Payer: Self-pay

## 2018-05-16 ENCOUNTER — Telehealth: Payer: BC Managed Care – PPO | Admitting: Family

## 2018-05-16 DIAGNOSIS — J302 Other seasonal allergic rhinitis: Secondary | ICD-10-CM

## 2018-05-16 MED ORDER — LEVOCETIRIZINE DIHYDROCHLORIDE 5 MG PO TABS: 5.0000 mg | ORAL_TABLET | Freq: Every evening | ORAL | 2 refills | Status: DC

## 2018-05-16 NOTE — Telephone Encounter (Signed)
Refill on emgality sent to patient pharmacy. [facility].

## 2018-05-16 NOTE — Progress Notes (Signed)
E visit for Allergic Rhinitis We are sorry that you are not feeling well.  Here is how we plan to help!  Based on what you have shared with me it looks like you have Allergic Rhinitis.  Rhinitis is when a reaction occurs that causes nasal congestion, runny nose, sneezing, and itching.  Most types of rhinitis are caused by an inflammation and are associated with symptoms in the eyes ears or throat. There are several types of rhinitis.  The most common are acute rhinitis, which is usually caused by a viral illness, allergic or seasonal rhinitis, and nonallergic or year-round rhinitis.  Nasal allergies occur certain times of the year.  Allergic rhinitis is caused when allergens in the air trigger the release of histamine in the body.  Histamine causes itching, swelling, and fluid to build up in the fragile linings of the nasal passages, sinuses and eyelids.  An itchy nose and clear discharge are common.  I recommend the following over the counter treatments: Xyzal 5 mg take 1 tablet daily. You may have to take this on a daily basis. I sent this to the pharmacy just in case you have a flex spending account.    HOME CARE:   You can use an over-the-counter saline nasal spray as needed  Avoid areas where there is heavy dust, mites, or molds  Stay indoors on windy days during the pollen season  Keep windows closed in home, at least in bedroom; use air conditioner.  Use high-efficiency house air filter  Keep windows closed in car, turn AC on re-circulate  Avoid playing out with dog during pollen season  GET HELP RIGHT AWAY IF:   If your symptoms do not improve within 10 days  You become short of breath  You develop yellow or green discharge from your nose for over 3 days  You have coughing fits  MAKE SURE YOU:   Understand these instructions  Will watch your condition  Will get help right away if you are not doing well or get worse  Thank you for choosing an e-visit. Your  e-visit answers were reviewed by a board certified advanced clinical practitioner to complete your personal care plan. Depending upon the condition, your plan could have included both over the counter or prescription medications. Please review your pharmacy choice. Be sure that the pharmacy you have chosen is open so that you can pick up your prescription now.  If there is a problem you may message your provider in [place] to have the prescription routed to another pharmacy. Your safety is important to [place]. If you have drug allergies check your prescription carefully.  For the next 24 hours, you can use MyChart to ask questions about today's visit, request a non-urgent call back, or ask for a work or school excuse from your e-visit provider. You will get an email in the next two days asking about your experience. I hope that your e-visit has been valuable and will speed your recovery.  Greater than 5 minutes, yet less than 10 minutes of time have been spent researching, coordinating, and implementing care for this patient today.  Thank you for the details you included in the comment boxes. Those details are very helpful in determining the best course of treatment for you and help [place] to provide the best care.

## 2018-05-28 ENCOUNTER — Ambulatory Visit (INDEPENDENT_AMBULATORY_CARE_PROVIDER_SITE_OTHER): Payer: BC Managed Care – PPO | Admitting: Primary Care

## 2018-05-28 ENCOUNTER — Encounter: Payer: Self-pay | Admitting: Primary Care

## 2018-05-28 DIAGNOSIS — R053 Chronic cough: Secondary | ICD-10-CM | POA: Insufficient documentation

## 2018-05-28 DIAGNOSIS — R05 Cough: Secondary | ICD-10-CM | POA: Insufficient documentation

## 2018-05-28 NOTE — Patient Instructions (Signed)
Continue taking Xyzal tablets for allergies. Continue Flonase and/or saline nasal sprays.  Add in omeprazole 20 mg tablets for potential silent acid reflux every evening at bedtime.   Please update me in 1-2 weeks.  It was a pleasure to see you today! Allie Bossier, NP-C

## 2018-05-28 NOTE — Assessment & Plan Note (Signed)
Chronic cough with allergy type symptoms. Differential diagnoses include chronic allergies, silent reflux, COPD. Given that she's had little improvement with current allergy regimen, she may be having silent reflux that is aggravated by her morning coffee and chronic allergies.  Start with continuing Xyzal, add in omeprazole 20 mg HS. Discussed triggers for GERD. If no improvement then consider Singulair vs COPD work up with spirometry. She will update.

## 2018-05-28 NOTE — Progress Notes (Signed)
Subjective:    Patient ID: [name], female    DOB: [birth year], 63 y.o.   MRN: [id number]  HPI  Virtual Visit via Video Note  I connected with [name] on 05/28/18 at 10:40 AM EDT by a video enabled telemedicine application and verified that I am speaking with the correct person using two identifiers.  Location: Patient: Home Provider: Office   I discussed the limitations of evaluation and management by telemedicine and the availability of in person appointments. The patient expressed understanding and agreed to proceed.   History of Present Illness:  [name] is a 63 year old female with a history of migraines, chronic allergic rhinitis, tobacco abuse (quit in 1999) who presents today with a chief complaint of cough.   She also reports nasal congestion, post nasal drip, rhinorrhea, some sneezing. Her symptoms have been present since mid February 2020 since coming back from [place] with her family.   She was evaluated on 03/14/18 after a one day sudden onset of URI symptoms and allergy symptoms. She was exposed to influenza. Influenza screening and exam were unremarkable so she was treated with conservative measures.  She was evaluated again on 04/22/18 through an E-visit, diagnosed with acute sinusitis, and treated with a 7 day Augmentin course. She was evaluated again through an E-visit on 05/16/18 with continued symptoms, diagnosed with allergic rhinitis. She was treated with Xyzal and saline nasal sprays.   Since then she doesn't ever feel as though her symptoms have resolved. She doesn't cough at night. She will start to cough during the morning after her morning coffee, cough will occur sporadically throughout the day. Cough is productive with clear to green mucous at times. She denies fevers, chills, fatigue, shortness of breath, esophageal burning, abdominal pain. She doesn't feel sick. She is compliant to Xyzal tablets daily, also saline and Flonase  saline nasal sprays, hasn't noticed a significant improvement.    Observations/Objective:  Alert and oriented. Appears well, not sickly. No distress. Speaking in complete sentences.  Assessment and Plan:  Chronic cough with allergy type symptoms. Differential diagnoses include chronic allergies, silent reflux, COPD. Given that she's had little improvement with current allergy regimen, she may be having silent reflux that is aggravated by her morning coffee and chronic allergies.  Start with continuing Xyzal, add in omeprazole 20 mg HS. Discussed triggers for GERD. If no improvement then consider Singulair vs COPD work up with spirometry. She will update.   Follow Up Instructions:  Continue taking Xyzal tablets for allergies. Continue Flonase and/or saline nasal sprays.  Add in omeprazole 20 mg tablets for potential silent acid reflux every evening at bedtime.   Please update me in 1-2 weeks.  It was a pleasure to see you today! Allie Bossier, NP-C    I discussed the assessment and treatment plan with the patient. The patient was provided an opportunity to ask questions and all were answered. The patient agreed with the plan and demonstrated an understanding of the instructions.   The patient was advised to call back or seek an in-person evaluation if the symptoms worsen or if the condition fails to improve as anticipated.     Pleas Koch, NP    Review of Systems  Constitutional: Negative for chills, fatigue and fever.  HENT: Positive for congestion, postnasal drip and rhinorrhea. Negative for ear pain, sinus pressure and sore throat.   Respiratory: Positive for cough. Negative for shortness of breath.   Allergic/Immunologic: Positive for  environmental allergies.       Past Medical History:  Diagnosis Date  . AR (allergic rhinitis)   . IBS (irritable bowel syndrome)   . Migraines    1-3x/wk  . Osteoarthritis   . Osteopenia      Social History    Socioeconomic History  . Marital status: Married    Spouse name: Not on file  . Number of children: 2  . Years of education: Not on file  . Highest education level: Not on file  Occupational History  . 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 Financial resource strain: Not on file  . Food insecurity:    Worry: Not on file    Inability: Not on file  . Transportation needs:    Medical: Not on file    Non-medical: Not on file  Tobacco Use  . Smoking status: Former Smoker    Years: 12.00  . Smokeless tobacco: Former Systems developer    Quit date: 01/24/1997  Substance and Sexual Activity  . Alcohol use: Yes    Alcohol/week: 3.0 standard drinks    Types: 1 Cans of beer, 2 Standard drinks or equivalent per week    Comment: Rare  . Drug use: No  . Sexual activity: Yes    Partners: Male  Lifestyle  . Physical activity:    Days per week: Not on file    Minutes per session: Not on file  . Stress: Not on file  Relationships  . Social connections:    Talks on phone: Not on file    Gets together: Not on file    Attends religious service: Not on file    Active member of club or organization: Not on file    Attends meetings of clubs or organizations: Not on file    Relationship status: Not on file  . Intimate partner violence:    Fear of current or ex partner: Not on file    Emotionally abused: Not on file    Physically abused: Not on file    Forced sexual activity: Not on file  Other Topics Concern  . Not on file  Social History Narrative  . Not on file    Past Surgical History:  Procedure Laterality Date  . BREAST CYST ASPIRATION Right 09/07/2017  . BROW LIFT Bilateral 08/11/2015   Procedure: BLEPHAROPLASTY BILATERAL UPPER EYELIDS;  Surgeon: Karle Starch, MD;  Location: [facility];  Service: Ophthalmology;  Laterality: Bilateral;  per cynde please leave patient first  . CARPAL TUNNEL RELEASE  3/12   right. Left done 2010. Dr Fredna Dow and left  side 2012  . CHOLECYSTECTOMY  4/85  . COLONOSCOPY    . DILATION AND CURETTAGE OF UTERUS  10/05  . tonsillectomy  12/76  . TUBAL LIGATION  1990  . VAGINAL DELIVERY     x 2    Family History  Problem Relation Age of Onset  . Macular degeneration Mother   . Hypertension Father   . Diabetes Father   . Hypertension Sister   . Cerebral aneurysm Sister   . Cancer Maternal Aunt        breast   . Diabetes Paternal Grandfather   . Cerebral aneurysm Brother   . Colon cancer Neg Hx   . Esophageal cancer Neg Hx   . Stomach cancer Neg Hx   . Rectal cancer Neg Hx     Allergies  Allergen Reactions  . Codeine  Current Outpatient Medications on File Prior to Visit  Medication Sig Dispense Refill  . Ascorbic Acid (VITAMIN C) 1000 MG tablet Take 1,000 mg by mouth daily. In the winter    . celecoxib (CELEBREX) 200 MG capsule Take 1 capsule (200 mg total) by mouth daily. 30 capsule 3  . EMGALITY 120 MG/ML SOAJ INJECT CONTENTS OF 1 DEVICE( 120 MG) UNDER THE SKIN ONCE MONTHLY 1 mL 1  . estradiol (ESTRACE) 1 MG tablet TK 1 T PO QD IN THE MORNING  12  . fluticasone (FLONASE) 50 MCG/ACT nasal spray Place 2 sprays into both nostrils daily.    . indomethacin (INDOCIN) 25 MG capsule TAKE 1 CAPSULE BY MOUTH EVERY DAY AS NEEDED 30 capsule 0  . levocetirizine (XYZAL) 5 MG tablet Take 1 tablet (5 mg total) by mouth every evening. 30 tablet 2  . meloxicam (MOBIC) 15 MG tablet Take 1 tablet (15 mg total) by mouth daily. 30 tablet 3  . sertraline (ZOLOFT) 100 MG tablet Take 1.5 tablets (150 mg total) by mouth daily. 45 tablet 6  . SUMAtriptan (IMITREX) 100 MG tablet TAKE 1 TABLET BY MOUTH IMMEDIATELY. MAY REPEAT AFTER 2 HOURS 12 tablet 0  . medroxyPROGESTERone (PROVERA) 5 MG tablet Take 1 tablet by mouth every morning for 12 days     No current facility-administered medications on file prior to visit.     There were no vitals taken for this visit.   Objective:   Physical Exam  Constitutional: She  is oriented to person, place, and time. She appears well-nourished. She does not have a sickly appearance. She does not appear ill.  Respiratory: Effort normal. No respiratory distress.  Neurological: She is alert and oriented to person, place, and time.  Psychiatric: She has a normal mood and affect.           Assessment & Plan:

## 2018-06-12 DIAGNOSIS — R05 Cough: Secondary | ICD-10-CM

## 2018-06-12 DIAGNOSIS — R053 Chronic cough: Secondary | ICD-10-CM

## 2018-06-13 ENCOUNTER — Ambulatory Visit (INDEPENDENT_AMBULATORY_CARE_PROVIDER_SITE_OTHER)
Admission: RE | Admit: 2018-06-13 | Discharge: 2018-06-13 | Disposition: A | Discharge status: Home / Self Care | Payer: BC Managed Care – PPO | Source: Ambulatory Visit | Attending: Primary Care | Admitting: Primary Care

## 2018-06-13 ENCOUNTER — Other Ambulatory Visit: Payer: Self-pay

## 2018-06-13 DIAGNOSIS — R05 Cough: Secondary | ICD-10-CM

## 2018-06-13 DIAGNOSIS — R053 Chronic cough: Secondary | ICD-10-CM

## 2018-06-13 NOTE — Addendum Note (Signed)
Addended by: Cloyd Stagers on: 06/13/2018 02:54 PM   Modules accepted: Orders

## 2018-06-19 ENCOUNTER — Ambulatory Visit (INDEPENDENT_AMBULATORY_CARE_PROVIDER_SITE_OTHER): Payer: BC Managed Care – PPO | Admitting: Internal Medicine

## 2018-06-19 DIAGNOSIS — R05 Cough: Secondary | ICD-10-CM

## 2018-06-19 DIAGNOSIS — J301 Allergic rhinitis due to pollen: Secondary | ICD-10-CM

## 2018-06-19 DIAGNOSIS — R053 Chronic cough: Secondary | ICD-10-CM

## 2018-06-19 MED ORDER — PREDNISONE 10 MG (21) PO TBPK: ORAL_TABLET | ORAL | 0 refills | Status: DC

## 2018-06-19 MED ORDER — BENZONATATE 100 MG PO CAPS: 200.0000 mg | ORAL_CAPSULE | Freq: Three times a day (TID) | ORAL | 2 refills | Status: AC

## 2018-06-19 MED ORDER — DOXYCYCLINE HYCLATE 100 MG PO CAPS: 100.0000 mg | ORAL_CAPSULE | Freq: Two times a day (BID) | ORAL | 0 refills | Status: DC

## 2018-06-19 NOTE — Progress Notes (Signed)
[facility] Consultation     Virtual Visit via Telephone Note I connected with patient on 06/19/18 at  9:00 AM EDT by telephone and verified that I am speaking with the correct person using two identifiers.   I discussed the limitations, risks, security and privacy concerns of performing an evaluation and management service by telephone and the availability of in person appointments. I also discussed with the patient that there may be a patient responsible charge related to this service. The patient expressed understanding and agreed to proceed. I discussed the assessment and treatment plan with the patient. The patient was provided an opportunity to ask questions and all were answered. The patient agreed with the plan and demonstrated an understanding of the instructions. Please see note below for further detail.    The patient was advised to call back or seek an in-person evaluation if the symptoms worsen or if the condition fails to improve as anticipated.   Laverle Hobby, MD   Assessment and Plan:  Chronic cough with acute on chronic bronchitis. Possible GERD. - Discussed that this may be a postinfectious cough versus other etiology such as GERD versus allergies versus asthma. - We will give course of prednisone, doxycycline.  Asked to continue other medications for cough.  She is asked to call us back in 2 to 3 weeks with progress, if not improved at that time we will consider repeat chest x-ray versus CT chest.  Date: 06/19/2018  MRN# [id number] SHARESE MANRIQUE 1955-02-20  Referring Physician: Gentry Fitz NP  SUNAINA FERRANDO is a 63 y.o. old female seen in consultation for chief complaint of: cough.     HPI:   The patient is a 63 year old female presents with a month history of cough, nasal congestion.  Patient has seen her primary care physician, she was treated with a course of antibiotics.  Symptoms persisted, she was treated with a course of Xyzal,  saline nasal spray, omeprazole. She had also received tessalon in Feb.  After taking the augmentin it improved and then came back. She does not cough at night, she starts coughing in the am after drinking coffee, she thinks that it is worse after a meal.  She has green mucus and no fever.  No hemoptysis.  She denies reflux.  She has not pets.  She has never been tested for allergies but she does have spring time allergies, has sneezing, coughing, eye allergies.  Currently her issue is only with coughing.  She has no trouble breathing, she has never taken any inhalers no respiratory issues in the past. She has not been tried on an inhaler or any other abx. She has not been on prednisone.  The tessalon helped.    **Chest x-ray 06/13/2018>> images personally viewed, there is a vague suggestion mild right hilar fullness which is likely due to vascularity, lungs are otherwise unremarkable.   PMHX:   Past Medical History:  Diagnosis Date  . AR (allergic rhinitis)   . IBS (irritable bowel syndrome)   . Migraines    1-3x/wk  . Osteoarthritis   . Osteopenia    Surgical Hx:  Past Surgical History:  Procedure Laterality Date  . BREAST CYST ASPIRATION Right 09/07/2017  . BROW LIFT Bilateral 08/11/2015   Procedure: BLEPHAROPLASTY BILATERAL UPPER EYELIDS;  Surgeon: Karle Starch, MD;  Location: [facility];  Service: Ophthalmology;  Laterality: Bilateral;  per cynde please leave patient first  . CARPAL TUNNEL RELEASE  3/12   right.  Left done 2010. Dr Fredna Dow and left side 2012  . CHOLECYSTECTOMY  4/85  . COLONOSCOPY    . DILATION AND CURETTAGE OF UTERUS  10/05  . tonsillectomy  12/76  . TUBAL LIGATION  1990  . VAGINAL DELIVERY     x 2   Family Hx:  Family History  Problem Relation Age of Onset  . Macular degeneration Mother   . Hypertension Father   . Diabetes Father   . Hypertension Sister   . Cerebral aneurysm Sister   . Cancer Maternal Aunt        breast   . Diabetes  Paternal Grandfather   . Cerebral aneurysm Brother   . Colon cancer Neg Hx   . Esophageal cancer Neg Hx   . Stomach cancer Neg Hx   . Rectal cancer Neg Hx    Social Hx:   Social History   Tobacco Use  . Smoking status: Former Smoker    Years: 12.00  . Smokeless tobacco: Former Systems developer    Quit date: 01/24/1997  Substance Use Topics  . Alcohol use: Yes    Alcohol/week: 3.0 standard drinks    Types: 1 Cans of beer, 2 Standard drinks or equivalent per week    Comment: Rare  . Drug use: No   Medication:    Current Outpatient Medications:  .  Ascorbic Acid (VITAMIN C) 1000 MG tablet, Take 1,000 mg by mouth daily. In the winter, Disp: , Rfl:  .  celecoxib (CELEBREX) 200 MG capsule, Take 1 capsule (200 mg total) by mouth daily., Disp: 30 capsule, Rfl: 3 .  EMGALITY 120 MG/ML SOAJ, INJECT CONTENTS OF 1 DEVICE( 120 MG) UNDER THE SKIN ONCE MONTHLY, Disp: 1 mL, Rfl: 1 .  estradiol (ESTRACE) 1 MG tablet, TK 1 T PO QD IN THE MORNING, Disp: , Rfl: 12 .  fluticasone (FLONASE) 50 MCG/ACT nasal spray, Place 2 sprays into both nostrils daily., Disp: , Rfl:  .  indomethacin (INDOCIN) 25 MG capsule, TAKE 1 CAPSULE BY MOUTH EVERY DAY AS NEEDED, Disp: 30 capsule, Rfl: 0 .  levocetirizine (XYZAL) 5 MG tablet, Take 1 tablet (5 mg total) by mouth every evening., Disp: 30 tablet, Rfl: 2 .  medroxyPROGESTERone (PROVERA) 5 MG tablet, Take 1 tablet by mouth every morning for 12 days, Disp: , Rfl:  .  meloxicam (MOBIC) 15 MG tablet, Take 1 tablet (15 mg total) by mouth daily., Disp: 30 tablet, Rfl: 3 .  sertraline (ZOLOFT) 100 MG tablet, Take 1.5 tablets (150 mg total) by mouth daily., Disp: 45 tablet, Rfl: 6 .  SUMAtriptan (IMITREX) 100 MG tablet, TAKE 1 TABLET BY MOUTH IMMEDIATELY. MAY REPEAT AFTER 2 HOURS, Disp: 12 tablet, Rfl: 0   Allergies:  Codeine  Review of Systems: Gen:  Denies  fever, sweats, chills HEENT: Denies blurred vision, double vision. bleeds, sore throat Cvc:  No dizziness, chest pain.  Resp:   Denies cough or sputum production, shortness of breath Gi: Denies swallowing difficulty, stomach pain. Gu:  Denies bladder incontinence, burning urine Ext:   No Joint pain, stiffness. Skin: No skin rash,  hives  Endoc:  No polyuria, polydipsia. Psych: No depression, insomnia. Other:  All other systems were reviewed with the patient and were negative other that what is mentioned in the HPI.   Physical Examination:   VS: There were no vitals taken for this visit.  General Appearance: No distress  Neuro:without focal findings,  speech normal,  HEENT: PERRLA, EOM intact.   Pulmonary: normal breath  sounds, No wheezing.  CardiovascularNormal S1,S2.  No m/r/g.   Abdomen: Benign, Soft, non-tender. Renal:  No costovertebral tenderness  GU:  No performed at this time. Endoc: No evident thyromegaly, no signs of acromegaly. Skin:   warm, no rashes, no ecchymosis  Extremities: normal, no cyanosis, clubbing.  Other findings:    LABORATORY PANEL:   CBC No results for input(s): WBC, HGB, HCT, PLT in the last 168 hours. ------------------------------------------------------------------------------------------------------------------  Chemistries  No results for input(s): NA, K, CL, CO2, GLUCOSE, BUN, CREATININE, CALCIUM, MG, AST, ALT, ALKPHOS, BILITOT in the last 168 hours.  Invalid input(s): GFRCGP ------------------------------------------------------------------------------------------------------------------  Cardiac Enzymes No results for input(s): TROPONINI in the last 168 hours. ------------------------------------------------------------  RADIOLOGY:  No results found.     Thank  you for the consultation and for allowing [facility] to assist in the care of your patient. Our recommendations are noted above.  Please contact us if we can be of further service.   Marda Stalker, M.D., F.C.C.P.  Board Certified in Internal Medicine,  Pulmonary Medicine, [place], and Sleep Medicine.  [facility] Office Number: [phone]   06/19/2018

## 2018-06-19 NOTE — Patient Instructions (Addendum)
Tessalon, 2 tablets three times per day.  Course of doxycycline. Course of prednisone.  Continue xyzal and omeprazole.  Call us back in 2-3 weeks with progress.

## 2018-06-28 ENCOUNTER — Telehealth: Payer: Self-pay | Admitting: Internal Medicine

## 2018-06-28 ENCOUNTER — Other Ambulatory Visit: Payer: Self-pay | Admitting: Internal Medicine

## 2018-06-28 DIAGNOSIS — R0609 Other forms of dyspnea: Secondary | ICD-10-CM

## 2018-06-28 NOTE — Telephone Encounter (Signed)
Spoke to patient, still having cough with green mucus, sometimes very thick almost hard. Still having congestion. She finished prednisone and doxycycline Monday this week. Also when she goes outside she starts to cough. She is wondering if she has asthma, Dr. Ashby Dawes had mentioned possible CT or Singulair.   DR please advise.

## 2018-06-28 NOTE — Telephone Encounter (Signed)
Called patient, she is aware. Will go to outpatient imaging center to obtain CXR. Nothing further at this time.

## 2018-06-28 NOTE — Telephone Encounter (Signed)
Ordered chest xray 

## 2018-06-29 ENCOUNTER — Ambulatory Visit
Admission: RE | Admit: 2018-06-29 | Discharge: 2018-06-29 | Disposition: A | Discharge status: Home / Self Care | Payer: BC Managed Care – PPO | Source: Ambulatory Visit | Attending: Internal Medicine | Admitting: Internal Medicine

## 2018-06-29 ENCOUNTER — Other Ambulatory Visit: Payer: Self-pay

## 2018-06-29 DIAGNOSIS — R0609 Other forms of dyspnea: Secondary | ICD-10-CM | POA: Insufficient documentation

## 2018-06-29 DIAGNOSIS — J42 Unspecified chronic bronchitis: Secondary | ICD-10-CM

## 2018-07-04 ENCOUNTER — Telehealth: Payer: Self-pay | Admitting: Internal Medicine

## 2018-07-04 NOTE — Telephone Encounter (Signed)
Chest x ray is normal, no pneumonia.

## 2018-07-04 NOTE — Telephone Encounter (Signed)
Spoke to patient, she is aware of results. She stated that she still coughs when she goes outside in the heat and humidity. Nothing further needed at this time.

## 2018-07-09 ENCOUNTER — Telehealth: Payer: Self-pay | Admitting: Internal Medicine

## 2018-07-09 MED ORDER — BREO ELLIPTA 100-25 MCG/INH IN AEPB: 1.0000 | INHALATION_SPRAY | Freq: Every day | RESPIRATORY_TRACT | 5 refills | Status: DC

## 2018-07-09 NOTE — Telephone Encounter (Signed)
Please let her know that Dr Ashby Dawes is the lung specialist who would decide about that. She would need special lung testing to determine if she might have asthma---which has been difficult due to COVID. Will send this message to Dr R as well

## 2018-07-09 NOTE — Telephone Encounter (Signed)
Margie, please have her start Breo 100 once daily and follow up with [place] in 2-3 weeks. Thanks.

## 2018-07-09 NOTE — Telephone Encounter (Signed)
Patient called the office today and stated that she has had 2 chest x rays and they came back good but She is still having a cough that she can not get rid of.  She received an e-mail from [facility] mentioning Asthma and getting information on this. When she pulled up the email it just says [place] but does not give her a name of a practice. So patient would like to know if she will be getting treated for asthma.   Please advise   Best [phone]

## 2018-07-09 NOTE — Telephone Encounter (Signed)
Pt has been scheduled for OV on 07/24/2018 at 10:45a. Rx for Breo 100 has been sent to preferred pharmacy. Pt is aware and voiced her understanding.  Nothing further is needed.

## 2018-07-12 ENCOUNTER — Other Ambulatory Visit: Payer: Self-pay | Admitting: Primary Care

## 2018-07-12 ENCOUNTER — Other Ambulatory Visit: Payer: Self-pay | Admitting: *Deleted

## 2018-07-12 DIAGNOSIS — J069 Acute upper respiratory infection, unspecified: Secondary | ICD-10-CM

## 2018-07-12 NOTE — Telephone Encounter (Signed)
Faxed over refill X 2 for emgality and note that pt will need appointment for further refills to [facility].

## 2018-07-16 ENCOUNTER — Other Ambulatory Visit: Payer: Self-pay | Admitting: *Deleted

## 2018-07-16 DIAGNOSIS — G43709 Chronic migraine without aura, not intractable, without status migrainosus: Secondary | ICD-10-CM

## 2018-07-16 MED ORDER — SERTRALINE HCL 100 MG PO TABS: 150.0000 mg | ORAL_TABLET | Freq: Every day | ORAL | 0 refills | Status: DC

## 2018-07-16 MED ORDER — SUMATRIPTAN SUCCINATE 100 MG PO TABS: ORAL_TABLET | ORAL | 0 refills | Status: DC

## 2018-07-20 ENCOUNTER — Encounter: Payer: BC Managed Care – PPO | Admitting: Internal Medicine

## 2018-07-23 ENCOUNTER — Telehealth: Payer: Self-pay | Admitting: Internal Medicine

## 2018-07-23 NOTE — Progress Notes (Signed)
[facility] Consultation      Assessment and Plan:  Chronic cough with acute on chronic bronchitis. Possible GERD. - Discussed that this may be a postinfectious cough versus other etiology such as GERD versus allergies versus asthma. - Cough appeared to be improved after most recent therapy.  She has since taken herself off of the Breo, Tessalon, antihistamine, omeprazole. - Asked that she start Brio once again, and continue to see if it helps with the cough.  If not she is asked to call us back and we will consider sending her for a CAT scan.  Orders Placed This Encounter  Procedures  . Expectorated sputum assessment w rflx to resp cult   Return in about 6 months (around 01/23/2019).   Date: 07/23/2018  MRN# [id number] [name] Apr 07, 1955   [name] is a 63 y.o. old female seen in consultation for chief complaint of: cough.     HPI:  [name] is a 63 y.o. female seen recently with a history of cough.  She had been treated with a course of antibiotics, Xyzal, nasal spray, omeprazole, Tessalon.  It seemed to respond to Augmentin but then returned.  At last visit she was given a course of prednisone, doxycycline.  Since her last visit she has done breo, and seems that her cough has improved. She was taking prilosec for possible reflux but stopped this week, she cut down on coffee. She stopped xyzal for one week. Stopped tessalon.  She has occasional nasal drainage.  She has never undergone allergy testing.    **Chest x-ray 06/13/2018>> images personally viewed, there is a vague suggestion mild right hilar fullness which is likely due to vascularity, lungs are otherwise unremarkable.   PMHX:   Past Medical History:  Diagnosis Date  . AR (allergic rhinitis)   . IBS (irritable bowel syndrome)   . Migraines    1-3x/wk  . Osteoarthritis   . Osteopenia    Surgical Hx:  Past Surgical History:  Procedure Laterality Date  . BREAST CYST  ASPIRATION Right 09/07/2017  . BROW LIFT Bilateral 08/11/2015   Procedure: BLEPHAROPLASTY BILATERAL UPPER EYELIDS;  Surgeon: Karle Starch, MD;  Location: [facility];  Service: Ophthalmology;  Laterality: Bilateral;  per cynde please leave patient first  . CARPAL TUNNEL RELEASE  3/12   right. Left done 2010. Dr Fredna Dow and left side 2012  . CHOLECYSTECTOMY  4/85  . COLONOSCOPY    . DILATION AND CURETTAGE OF UTERUS  10/05  . tonsillectomy  12/76  . TUBAL LIGATION  1990  . VAGINAL DELIVERY     x 2   Family Hx:  Family History  Problem Relation Age of Onset  . Macular degeneration Mother   . Hypertension Father   . Diabetes Father   . Hypertension Sister   . Cerebral aneurysm Sister   . Cancer Maternal Aunt        breast   . Diabetes Paternal Grandfather   . Cerebral aneurysm Brother   . Colon cancer Neg Hx   . Esophageal cancer Neg Hx   . Stomach cancer Neg Hx   . Rectal cancer Neg Hx    Social Hx:   Social History   Tobacco Use  . Smoking status: Former Smoker    Years: 12.00  . Smokeless tobacco: Former Systems developer    Quit date: 01/24/1997  Substance Use Topics  . Alcohol use: Yes    Alcohol/week: 3.0 standard drinks  Types: 1 Cans of beer, 2 Standard drinks or equivalent per week    Comment: Rare  . Drug use: No   Medication:    Current Outpatient Medications:  .  Ascorbic Acid (VITAMIN C) 1000 MG tablet, Take 1,000 mg by mouth daily. In the winter, Disp: , Rfl:  .  benzonatate (TESSALON PERLES) 100 MG capsule, Take 2 capsules (200 mg total) by mouth 3 (three) times daily., Disp: 90 capsule, Rfl: 2 .  celecoxib (CELEBREX) 200 MG capsule, Take 1 capsule (200 mg total) by mouth daily., Disp: 30 capsule, Rfl: 3 .  doxycycline (VIBRAMYCIN) 100 MG capsule, Take 1 capsule (100 mg total) by mouth 2 (two) times daily., Disp: 10 capsule, Rfl: 0 .  EMGALITY 120 MG/ML SOAJ, INJECT CONTENTS OF 1 DEVICE( 120 MG) UNDER THE SKIN ONCE MONTHLY, Disp: 1 mL, Rfl: 1 .  estradiol  (ESTRACE) 1 MG tablet, TK 1 T PO QD IN THE MORNING, Disp: , Rfl: 12 .  fluticasone (FLONASE) 50 MCG/ACT nasal spray, Place 2 sprays into both nostrils daily., Disp: , Rfl:  .  fluticasone furoate-vilanterol (BREO ELLIPTA) 100-25 MCG/INH AEPB, Inhale 1 puff into the lungs daily., Disp: 60 each, Rfl: 5 .  indomethacin (INDOCIN) 25 MG capsule, TAKE 1 CAPSULE BY MOUTH EVERY DAY AS NEEDED, Disp: 30 capsule, Rfl: 0 .  levocetirizine (XYZAL) 5 MG tablet, Take 1 tablet (5 mg total) by mouth every evening., Disp: 30 tablet, Rfl: 2 .  medroxyPROGESTERone (PROVERA) 5 MG tablet, Take 1 tablet by mouth every morning for 12 days, Disp: , Rfl:  .  meloxicam (MOBIC) 15 MG tablet, Take 1 tablet (15 mg total) by mouth daily., Disp: 30 tablet, Rfl: 3 .  predniSONE (STERAPRED UNI-PAK 21 TAB) 10 MG (21) TBPK tablet, Take as directed., Disp: 21 tablet, Rfl: 0 .  sertraline (ZOLOFT) 100 MG tablet, Take 1.5 tablets (150 mg total) by mouth daily., Disp: 45 tablet, Rfl: 0 .  SUMAtriptan (IMITREX) 100 MG tablet, May repeat in 2 hours if headache persists or recurs., Disp: 12 tablet, Rfl: 0   Allergies:  Codeine  Review of Systems:  Constitutional: Feels well. Cardiovascular: Denies chest pain, exertional chest pain.  Pulmonary: Denies hemoptysis, pleuritic chest pain.   The remainder of systems were reviewed and were found to be negative other than what is documented in the HPI.    Physical Examination:   VS: BP 126/78 (BP Location: Right Arm, Cuff Size: Normal)   Pulse 67   Temp 97.6 F (36.4 C) (Skin)   Ht 5' 3.5" (1.613 m)   Wt 167 lb (75.8 kg)   SpO2 96%   BMI 29.12 kg/m   General Appearance: No distress  Neuro:without focal findings, mental status, speech normal, alert and oriented HEENT: PERRLA, EOM intact Pulmonary: No wheezing, No rales  CardiovascularNormal S1,S2.  No m/r/g.  Abdomen: Benign, Soft, non-tender, No masses Renal:  No costovertebral tenderness  GU:  No performed at this time.  Endoc: No evident thyromegaly, no signs of acromegaly or Cushing features Skin:   warm, no rashes, no ecchymosis  Extremities: normal, no cyanosis, clubbing.      LABORATORY PANEL:   CBC No results for input(s): WBC, HGB, HCT, PLT in the last 168 hours. ------------------------------------------------------------------------------------------------------------------  Chemistries  No results for input(s): NA, K, CL, CO2, GLUCOSE, BUN, CREATININE, CALCIUM, MG, AST, ALT, ALKPHOS, BILITOT in the last 168 hours.  Invalid input(s): GFRCGP ------------------------------------------------------------------------------------------------------------------  Cardiac Enzymes No results for input(s): TROPONINI in the last 168  hours. ------------------------------------------------------------  RADIOLOGY:  No results found.     Thank  you for the consultation and for allowing [facility] to assist in the care of your patient. Our recommendations are noted above.  Please contact us if we can be of further service.   Marda Stalker, M.D., F.C.C.P.  Board Certified in Internal Medicine, Pulmonary Medicine, [place], and Sleep Medicine.  [facility] Office Number: [phone]   07/23/2018

## 2018-07-23 NOTE — Telephone Encounter (Signed)
I think this should be okay, she has had a cough for a while now.

## 2018-07-23 NOTE — Telephone Encounter (Signed)
Called patient for COVID-19 pre-screening for in office visit.  Have you recently traveled any where out of the local area in the last 2 weeks? No  Have you been in close contact with a person diagnosed with COVID-19 or someone awaiting results within the last 2 weeks? No  Do you currently have any of the following symptoms? If so, when did they start? Cough (Yes- couple months)              Diarrhea                         Joint Pain Fever      Muscle Pain   Red eyes Shortness of breath   Abdominal pain  Vomiting Loss of smell    Rash    Sore Throat Headache    Weakness   Bruising or bleeding   Okay to proceed with visit 07/24/2018

## 2018-07-24 ENCOUNTER — Ambulatory Visit: Payer: BC Managed Care – PPO | Admitting: Internal Medicine

## 2018-07-24 ENCOUNTER — Encounter: Payer: BC Managed Care – PPO | Admitting: Internal Medicine

## 2018-07-24 ENCOUNTER — Other Ambulatory Visit: Payer: Self-pay | Admitting: *Deleted

## 2018-07-24 ENCOUNTER — Other Ambulatory Visit: Payer: Self-pay

## 2018-07-24 ENCOUNTER — Encounter: Payer: Self-pay | Admitting: Internal Medicine

## 2018-07-24 VITALS — BP 126/78 | HR 67 | Temp 97.6°F | Ht 63.5 in | Wt 167.0 lb

## 2018-07-24 DIAGNOSIS — R0609 Other forms of dyspnea: Secondary | ICD-10-CM | POA: Diagnosis not present

## 2018-07-24 DIAGNOSIS — J42 Unspecified chronic bronchitis: Secondary | ICD-10-CM | POA: Diagnosis not present

## 2018-07-24 DIAGNOSIS — J301 Allergic rhinitis due to pollen: Secondary | ICD-10-CM | POA: Diagnosis not present

## 2018-07-24 DIAGNOSIS — R05 Cough: Secondary | ICD-10-CM | POA: Diagnosis not present

## 2018-07-24 DIAGNOSIS — R053 Chronic cough: Secondary | ICD-10-CM

## 2018-07-24 MED ORDER — EMGALITY 120 MG/ML ~~LOC~~ SOAJ: 1.0000 [IU] | SUBCUTANEOUS | Status: DC

## 2018-07-24 NOTE — Patient Instructions (Signed)
Can try off Breo, if cough returns then restart. Use Tessalon if needed.   We will check a sputum culture.  We will see her back in 6 months, or sooner if needed if you would like to do a CAT scan.

## 2018-07-26 ENCOUNTER — Other Ambulatory Visit
Admission: RE | Admit: 2018-07-26 | Discharge: 2018-07-26 | Disposition: A | Discharge status: Home / Self Care | Payer: BC Managed Care – PPO | Source: Ambulatory Visit | Attending: Internal Medicine | Admitting: Internal Medicine

## 2018-07-26 ENCOUNTER — Telehealth: Payer: Self-pay | Admitting: Internal Medicine

## 2018-07-26 DIAGNOSIS — J42 Unspecified chronic bronchitis: Secondary | ICD-10-CM | POA: Insufficient documentation

## 2018-07-26 LAB — EXPECTORATED SPUTUM ASSESSMENT W GRAM STAIN, RFLX TO RESP C

## 2018-07-26 NOTE — Telephone Encounter (Signed)
I spoke to the lab, they agreed that it is not their policy for the patient to have paperwork in order to take a specimen. She will discuss with her supervisor. She did find the specimen in the fridge and will begin looking at it to see if it is adequate.   I then called and informed the patient of above. She is aware. Patient stated she is going out of town and will not be back until next week. I told her to call us when she gets back and see if we have results by then. She is aware. Nothing further at this time.

## 2018-07-26 NOTE — Telephone Encounter (Signed)
Pt called back in reference to below note.  CB# [phone]

## 2018-07-26 NOTE — Telephone Encounter (Signed)
I spoke to patient, she stated they took her sputum sample but stated she needed paperwork. I will call the lab to confirm.

## 2018-07-26 NOTE — Telephone Encounter (Signed)
Pt is aware of need to repeat sputum sample.  Pt stated that she would come by the lab and obtain cup.  Nothing further is needed.

## 2018-07-26 NOTE — Telephone Encounter (Signed)
Received call from susan in lab, who stated sputum culture is not acceptable, due to amount of spit. Lm to make pt aware that another sputum sample is needed

## 2018-08-02 ENCOUNTER — Other Ambulatory Visit: Payer: Self-pay

## 2018-08-02 ENCOUNTER — Other Ambulatory Visit: Payer: Self-pay | Admitting: Family Medicine

## 2018-08-02 DIAGNOSIS — Z20822 Contact with and (suspected) exposure to covid-19: Secondary | ICD-10-CM

## 2018-08-02 DIAGNOSIS — J42 Unspecified chronic bronchitis: Secondary | ICD-10-CM

## 2018-08-02 DIAGNOSIS — R053 Chronic cough: Secondary | ICD-10-CM

## 2018-08-02 DIAGNOSIS — R05 Cough: Secondary | ICD-10-CM

## 2018-08-02 NOTE — Progress Notes (Signed)
I spoke to [place] in the lab to confirm that patient does not need any paperwork to bring her sputum in. Lovey Newcomer confirmed she just needs name, dob, date and time of collection on the specimen cup. Order re-entered.

## 2018-08-06 ENCOUNTER — Other Ambulatory Visit: Payer: Self-pay

## 2018-08-06 DIAGNOSIS — G43709 Chronic migraine without aura, not intractable, without status migrainosus: Secondary | ICD-10-CM

## 2018-08-06 MED ORDER — SERTRALINE HCL 100 MG PO TABS: 150.0000 mg | ORAL_TABLET | Freq: Every day | ORAL | 1 refills | Status: DC

## 2018-08-06 NOTE — Telephone Encounter (Signed)
Request a refill on Sertraline 100mg .

## 2018-08-07 ENCOUNTER — Other Ambulatory Visit: Payer: Self-pay | Admitting: Otolaryngology

## 2018-08-07 DIAGNOSIS — R059 Cough, unspecified: Secondary | ICD-10-CM

## 2018-08-07 DIAGNOSIS — R519 Headache, unspecified: Secondary | ICD-10-CM

## 2018-08-07 DIAGNOSIS — R05 Cough: Secondary | ICD-10-CM

## 2018-08-07 LAB — NOVEL CORONAVIRUS, NAA: SARS-CoV-2, NAA: NOT DETECTED

## 2018-08-08 ENCOUNTER — Telehealth: Payer: Self-pay | Admitting: Internal Medicine

## 2018-08-08 DIAGNOSIS — R0609 Other forms of dyspnea: Secondary | ICD-10-CM

## 2018-08-08 NOTE — Telephone Encounter (Signed)
ok 

## 2018-08-08 NOTE — Telephone Encounter (Signed)
PFT has been ordered.  Nothing further is needed.

## 2018-08-08 NOTE — Telephone Encounter (Signed)
Received fax request from [facility] for PFT.   DR please advise if okay to order.

## 2018-08-13 ENCOUNTER — Other Ambulatory Visit: Payer: Self-pay

## 2018-08-13 ENCOUNTER — Ambulatory Visit: Payer: BC Managed Care – PPO

## 2018-08-13 ENCOUNTER — Ambulatory Visit
Admission: RE | Admit: 2018-08-13 | Discharge: 2018-08-13 | Disposition: A | Discharge status: Home / Self Care | Payer: BC Managed Care – PPO | Source: Ambulatory Visit | Attending: Otolaryngology | Admitting: Otolaryngology

## 2018-08-13 DIAGNOSIS — R51 Headache: Secondary | ICD-10-CM | POA: Diagnosis not present

## 2018-08-13 DIAGNOSIS — R519 Headache, unspecified: Secondary | ICD-10-CM

## 2018-08-14 ENCOUNTER — Other Ambulatory Visit: Payer: Self-pay | Admitting: Family

## 2018-08-22 ENCOUNTER — Other Ambulatory Visit: Payer: Self-pay

## 2018-08-24 ENCOUNTER — Other Ambulatory Visit: Payer: Self-pay

## 2018-08-31 ENCOUNTER — Other Ambulatory Visit: Payer: Self-pay

## 2018-08-31 ENCOUNTER — Encounter: Payer: Self-pay | Admitting: Physician Assistant

## 2018-08-31 ENCOUNTER — Telehealth (INDEPENDENT_AMBULATORY_CARE_PROVIDER_SITE_OTHER): Payer: BC Managed Care – PPO | Admitting: Physician Assistant

## 2018-08-31 DIAGNOSIS — G43009 Migraine without aura, not intractable, without status migrainosus: Secondary | ICD-10-CM

## 2018-08-31 MED ORDER — SERTRALINE HCL 100 MG PO TABS: 100.0000 mg | ORAL_TABLET | Freq: Every day | ORAL | 11 refills | Status: DC

## 2018-08-31 MED ORDER — EMGALITY 120 MG/ML ~~LOC~~ SOAJ: 1.0000 [IU] | SUBCUTANEOUS | 11 refills | Status: DC

## 2018-08-31 MED ORDER — SUMATRIPTAN SUCCINATE 100 MG PO TABS: ORAL_TABLET | ORAL | 11 refills | Status: DC

## 2018-08-31 NOTE — Progress Notes (Signed)
I connected with  [name] on 08/31/18 at  9:30 AM EDT by telephone and verified that I am speaking with the correct person using two identifiers.   I discussed the limitations, risks, security and privacy concerns of performing an evaluation and management service by telephone and the availability of in person appointments. I also discussed with the patient that there may be a patient responsible charge related to this service. The patient expressed understanding and agreed to proceed.  Crosby Oyster, RN 08/31/2018  9:37 AM     TELEHEALTH VIRTUAL HEADACHE VISIT ENCOUNTER NOTE  I connected with [name] on 08/31/18 at  9:30 AM EDT by telephone at home and verified that I am speaking with the correct person using two identifiers.   I discussed the limitations, risks, security and privacy concerns of performing an evaluation and management service by telephone and the availability of in person appointments. I also discussed with the patient that there may be a patient responsible charge related to this service. The patient expressed understanding and agreed to proceed.   History:  KENNEDEE [name] is a 63 y.o. G1P0002 female being evaluated today for headache.  She has seen great results with the emgality.  She has maybe 1-2 headaches per month.  She still uses imitrex for relief of the migraine and finds it effective.  She has eliminated use of estradiol.  She has decreased the sertraline to 100mg  daily.   She is having ENT issues and was recently on levaquin for sinus congestion (productive cough since February of this year) and may have upcoming sinus surgery.  This has not resulted in worsening of headache.       Past Medical History:  Diagnosis Date  . AR (allergic rhinitis)   . IBS (irritable bowel syndrome)   . Migraines    1-3x/wk  . Osteoarthritis   . Osteopenia    Past Surgical History:  Procedure Laterality Date  . BREAST CYST ASPIRATION Right 09/07/2017  .  BROW LIFT Bilateral 08/11/2015   Procedure: BLEPHAROPLASTY BILATERAL UPPER EYELIDS;  Surgeon: Karle Starch, MD;  Location: [facility];  Service: Ophthalmology;  Laterality: Bilateral;  per cynde please leave patient first  . CARPAL TUNNEL RELEASE  3/12   right. Left done 2010. Dr Fredna Dow and left side 2012  . CHOLECYSTECTOMY  4/85  . COLONOSCOPY    . DILATION AND CURETTAGE OF UTERUS  10/05  . tonsillectomy  12/76  . TUBAL LIGATION  1990  . VAGINAL DELIVERY     x 2   The following portions of the patient's history were reviewed and updated as appropriate: allergies, current medications, past family history, past medical history, past social history, past surgical history and problem list.   Review of Systems:  Pertinent items noted in HPI and remainder of comprehensive ROS otherwise negative.  Physical Exam:   General:  Alert, oriented and cooperative.   Mental Status: Normal mood and affect perceived. Normal judgment and thought content.  Physical exam deferred due to nature of the encounter  Labs and Imaging No results found for this or any previous visit (from the past 336 hour(s)). Ct Maxillofacial Wo Contrast  Result Date: 08/14/2018 CLINICAL DATA:  Sinus problems for 5 months EXAM: CT MAXILLOFACIAL WITHOUT CONTRAST TECHNIQUE: Multidetector CT images of the paranasal sinuses were obtained using the standard protocol without intravenous contrast. COMPARISON:  Brain MRI 02/24/2016 FINDINGS: Paranasal sinuses: Frontal: Completely opacified hypoplastic left frontal sinus. The large right frontal  sinus crosses midline and is clear. Ethmoid: Moderate patchy opacification on both sides with dense and frothy opacification. Maxillary: Minor mucosal thickening on both sides. Sphenoid: Small fluid level in the right sphenoid sinus. Both sphenoid ostia are not visible, but the sphenoid ethmoidal recesses are patent Right ostiomeatal unit: Narrow infundibulum due to uncinate positioning and  mild mucosal thickening. Patent middle meatus Left ostiomeatal unit: Patent with mild narrowing from mucosal thickening. Nasal passages: Patent. Intact nasal septum is midline. Anatomy: No pneumatization superior to anterior ethmoid notches. Sellar sphenoid pneumatization pattern. No dehiscence of carotid or optic canals. No onodi cell. Other: Minimal right mastoid opacification with negative nasopharynx. IMPRESSION: 1.  Moderate patchy bilateral ethmoid sinusitis. 2. 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 3. Small right sphenoid fluid level. Electronically Signed   By: Monte Fantasia M.D.   On: 08/14/2018 10:03      Assessment and Plan:     1. Migraine without aura and without status migrainosus, not intractable       Continue with Emgality monthly.  Savings card and sample provided to pt.   Continue with imitrex prn F/u 1 year or PRN   I discussed the assessment and treatment plan with the patient. The patient was provided an opportunity to ask questions and all were answered. The patient agreed with the plan and demonstrated an understanding of the instructions.   The patient was advised to call back or seek an in-person evaluation/go to the ED if the symptoms worsen or if the condition fails to improve as anticipated.  I provided 15 minutes of non-face-to-face time during this encounter.   Paticia Stack, PA-C [facility], [place]

## 2018-09-06 ENCOUNTER — Telehealth: Payer: Self-pay | Admitting: Internal Medicine

## 2018-09-06 NOTE — Telephone Encounter (Signed)
Left message to make pt aware of date/time for covid testing.  09/10/2018 [facility] 12:30[facility]

## 2018-09-07 ENCOUNTER — Other Ambulatory Visit: Payer: Self-pay

## 2018-09-07 NOTE — Telephone Encounter (Signed)
Pt is aware of date/time of covid testing. Nothing further is needed.  

## 2018-09-10 ENCOUNTER — Other Ambulatory Visit: Admission: RE | Admit: 2018-09-10 | Payer: BC Managed Care – PPO | Source: Ambulatory Visit

## 2018-09-10 ENCOUNTER — Other Ambulatory Visit: Payer: Self-pay | Admitting: Physician Assistant

## 2018-09-10 ENCOUNTER — Other Ambulatory Visit: Payer: Self-pay

## 2018-09-10 ENCOUNTER — Other Ambulatory Visit
Admission: RE | Admit: 2018-09-10 | Discharge: 2018-09-10 | Disposition: A | Discharge status: Home / Self Care | Payer: BC Managed Care – PPO | Source: Ambulatory Visit | Attending: Internal Medicine | Admitting: Internal Medicine

## 2018-09-10 DIAGNOSIS — Z01812 Encounter for preprocedural laboratory examination: Secondary | ICD-10-CM | POA: Diagnosis present

## 2018-09-10 DIAGNOSIS — Z20828 Contact with and (suspected) exposure to other viral communicable diseases: Secondary | ICD-10-CM | POA: Insufficient documentation

## 2018-09-11 LAB — SARS CORONAVIRUS 2 (TAT 6-24 HRS): SARS Coronavirus 2: NEGATIVE

## 2018-09-12 ENCOUNTER — Other Ambulatory Visit: Payer: Self-pay

## 2018-09-12 ENCOUNTER — Ambulatory Visit: Payer: BC Managed Care – PPO | Attending: Internal Medicine

## 2018-09-12 DIAGNOSIS — R0609 Other forms of dyspnea: Secondary | ICD-10-CM | POA: Insufficient documentation

## 2018-09-13 ENCOUNTER — Ambulatory Visit: Payer: BC Managed Care – PPO

## 2018-09-18 ENCOUNTER — Telehealth: Payer: Self-pay | Admitting: Internal Medicine

## 2018-09-18 NOTE — Telephone Encounter (Signed)
Called and spoke to pt, who is requesting PFT results.   DR please advise.

## 2018-09-18 NOTE — Telephone Encounter (Signed)
Pt is aware results and voiced her understanding.  Nothing further is needed.

## 2018-09-18 NOTE — Telephone Encounter (Signed)
Her lung function is normal without any evidence of copd or emphysema.

## 2019-03-25 ENCOUNTER — Encounter: Payer: Self-pay | Admitting: *Deleted

## 2019-07-09 ENCOUNTER — Ambulatory Visit: Payer: BC Managed Care – PPO | Admitting: Dermatology

## 2019-07-09 ENCOUNTER — Other Ambulatory Visit: Payer: Self-pay

## 2019-07-09 ENCOUNTER — Encounter: Payer: Self-pay | Admitting: Dermatology

## 2019-07-09 DIAGNOSIS — L570 Actinic keratosis: Secondary | ICD-10-CM | POA: Diagnosis not present

## 2019-07-09 DIAGNOSIS — L578 Other skin changes due to chronic exposure to nonionizing radiation: Secondary | ICD-10-CM | POA: Diagnosis not present

## 2019-07-09 DIAGNOSIS — L821 Other seborrheic keratosis: Secondary | ICD-10-CM

## 2019-07-09 NOTE — Patient Instructions (Addendum)
Cryotherapy Aftercare  . Wash gently with soap and water everyday.   . Apply Vaseline and Band-Aid daily until healed.  

## 2019-07-09 NOTE — Progress Notes (Addendum)
   Follow-Up Visit   Subjective  [name] is a 64 y.o. female who presents for the following: spot (left lower lip x a few months, gets crusty) and spot (scalp, pt scratches).   The following portions of the chart were reviewed this encounter and updated as appropriate:      Review of Systems:  No other skin or systemic complaints except as noted in HPI or Assessment and Plan.  Objective  Well appearing patient in no apparent distress; mood and affect are within normal limits.  A focused examination was performed including face. Relevant physical exam findings are noted in the Assessment and Plan.  Objective  Left Lower Lip at [facility]: Erythematous macule with gritty scale.   Objective  Mid Frontal Scalp: Waxy tan flat papule.   Assessment & Plan   Actinic Damage - diffuse scaly erythematous macules with underlying dyspigmentation - Recommend daily broad spectrum sunscreen SPF 30+ to sun-exposed areas, reapply every 2 hours as needed.  - Call for new or changing lesions.   AK (actinic keratosis) Left Lower Lip at [facility] of lesion - Left Lower Lip at [facility] method: cryotherapy   Informed consent: discussed and consent obtained   Lesion destroyed using liquid nitrogen: Yes   Region frozen until ice ball extended beyond lesion: Yes   Outcome: patient tolerated procedure well with no complications   Post-procedure details: wound care instructions given    Seborrheic keratosis Mid Frontal Scalp  Benign, observe.    Return for TBSE as scheduled with Dr Dara Lords, Jamesetta Orleans, CMA, am acting as scribe for Brendolyn Patty, MD .  Documentation: I have reviewed the above documentation for accuracy and completeness, and I agree with the above.  Brendolyn Patty MD

## 2019-09-07 ENCOUNTER — Other Ambulatory Visit: Payer: Self-pay | Admitting: Physician Assistant

## 2019-09-15 ENCOUNTER — Other Ambulatory Visit: Payer: Self-pay | Admitting: Physician Assistant

## 2019-09-15 DIAGNOSIS — G43009 Migraine without aura, not intractable, without status migrainosus: Secondary | ICD-10-CM

## 2019-09-18 ENCOUNTER — Other Ambulatory Visit: Payer: Self-pay | Admitting: Physician Assistant

## 2019-09-18 DIAGNOSIS — G43009 Migraine without aura, not intractable, without status migrainosus: Secondary | ICD-10-CM

## 2019-09-20 ENCOUNTER — Other Ambulatory Visit: Payer: Self-pay

## 2019-09-20 ENCOUNTER — Ambulatory Visit: Payer: BC Managed Care – PPO | Admitting: Physician Assistant

## 2019-09-20 ENCOUNTER — Encounter: Payer: Self-pay | Admitting: Physician Assistant

## 2019-09-20 DIAGNOSIS — G43009 Migraine without aura, not intractable, without status migrainosus: Secondary | ICD-10-CM

## 2019-09-20 MED ORDER — NURTEC 75 MG PO TBDP: 75.0000 mg | ORAL_TABLET | ORAL | 11 refills | Status: DC | PRN

## 2019-09-20 MED ORDER — SUMATRIPTAN SUCCINATE 100 MG PO TABS: ORAL_TABLET | ORAL | 11 refills | Status: DC

## 2019-09-20 MED ORDER — EMGALITY 120 MG/ML ~~LOC~~ SOAJ: SUBCUTANEOUS | 11 refills | Status: DC

## 2019-09-20 MED ORDER — SERTRALINE HCL 100 MG PO TABS: 100.0000 mg | ORAL_TABLET | Freq: Every day | ORAL | 11 refills | Status: DC

## 2019-09-20 NOTE — Patient Instructions (Signed)

## 2019-09-20 NOTE — Progress Notes (Signed)
History:  [name] is a 64 y.o. G2P0002 who presents to clinic today for yearly headache eval.  She is having more headaches, feeling like the emgality's benefits are waning.  It was amazing last year but slowly her headaches have returned.  Her acute meds are still working.   : Number of days in the last 4 weeks with:  Severe headache: 0 Moderate headache:2 Mild headache: 10  No headache: 16  Past Medical History:  Diagnosis Date  . AR (allergic rhinitis)   . Dysplastic nevus 06/14/2007   Left lateral back braline, slight  . Dysplastic nevus 03/18/2009   R mid to low back paraspinal, excision, mod-severe  . IBS (irritable bowel syndrome)   . Migraines    1-3x/wk  . Osteoarthritis   . Osteopenia     Social History   Socioeconomic History  . Marital status: Married    Spouse name: Not on file  . Number of children: 2  . Years of education: Not on file  . Highest education level: Not on file  Occupational History  . Occupation: Treasurer/Secretary    Comment: [employer]   Tobacco Use  . Smoking status: Former Smoker    Years: 12.00    Types: Cigarettes    Quit date: 01/24/1988    Years since quitting: 31.6  . Smokeless tobacco: Never Used  Vaping Use  . Vaping Use: Never used  Substance and Sexual Activity  . Alcohol use: Yes    Alcohol/week: 3.0 standard drinks    Types: 1 Cans of beer, 2 Standard drinks or equivalent per week    Comment: Rare  . Drug use: No  . Sexual activity: Yes    Partners: Male  Other Topics Concern  . Not on file  Social History Narrative  . Not on file   Social Determinants of Health   Financial Resource Strain:   . Difficulty of Paying Living Expenses: Not on file  Food Insecurity:   . Worried About Charity fundraiser in the Last Year: Not on file  . Ran Out of Food in the Last Year: Not on file  Transportation Needs:   . Lack of Transportation (Medical): Not on file  . Lack of Transportation (Non-Medical): Not on  file  Physical Activity:   . Days of Exercise per Week: Not on file  . Minutes of Exercise per Session: Not on file  Stress:   . Feeling of Stress : Not on file  Social Connections:   . Frequency of Communication with Friends and Family: Not on file  . Frequency of Social Gatherings with Friends and Family: Not on file  . Attends Religious Services: Not on file  . Active Member of Clubs or Organizations: Not on file  . Attends Archivist Meetings: Not on file  . Marital Status: Not on file  Intimate Partner Violence:   . Fear of Current or Ex-Partner: Not on file  . Emotionally Abused: Not on file  . Physically Abused: Not on file  . Sexually Abused: Not on file    Family History  Problem Relation Age of Onset  . Macular degeneration Mother   . Hypertension Father   . Diabetes Father   . Hypertension Sister   . Cerebral aneurysm Sister   . Cancer Maternal Aunt        breast   . Diabetes Paternal Grandfather   . Cerebral aneurysm Brother   . Colon cancer Neg Hx   .  Esophageal cancer Neg Hx   . Stomach cancer Neg Hx   . Rectal cancer Neg Hx     Allergies  Allergen Reactions  . Codeine     Current Outpatient Medications on File Prior to Visit  Medication Sig Dispense Refill  . EMGALITY 120 MG/ML SOAJ INJECT 240MG (2 SYRINGES) INTO THE SKIN ONCE FOR 1ST DOSE, THEN 120MG (1 SYRINGE) MONTHLY 1 mL 11  . levocetirizine (XYZAL) 5 MG tablet Take 1 tablet (5 mg total) by mouth every evening. 30 tablet 2  . meloxicam (MOBIC) 15 MG tablet Take 1 tablet (15 mg total) by mouth daily. 30 tablet 3  . progesterone (PROMETRIUM) 200 MG capsule progesterone micronized 200 mg capsule    . sertraline (ZOLOFT) 100 MG tablet Take 1 tablet (100 mg total) by mouth daily. 30 tablet 11  . SUMAtriptan (IMITREX) 100 MG tablet May repeat in 2 hours if headache persists or recurs. 12 tablet 11  . fluticasone (FLONASE) 50 MCG/ACT nasal spray Place 2 sprays into both nostrils daily. (Patient  not taking: Reported on 07/09/2019)    . fluticasone furoate-vilanterol (BREO ELLIPTA) 100-25 MCG/INH AEPB Inhale 1 puff into the lungs daily. (Patient not taking: Reported on 07/24/2018) 60 each 5   No current facility-administered medications on file prior to visit.     Review of Systems:  All pertinent positive/negative included in HPI, all other review of systems are negative   Objective:  Physical Exam BP 127/81   Pulse 75  CONSTITUTIONAL: Well-developed, well-nourished female in no acute distress.  EYES: EOM intact ENT: Normocephalic CARDIOVASCULAR: Regular rate  RESPIRATORY: Normal rate.  MUSCULOSKELETAL: Normal ROM SKIN: Warm, dry without erythema  NEUROLOGICAL: Alert, oriented, CN II-XII grossly intact, Appropriate balance PSYCH: Normal behavior, mood   Assessment & Plan:  Assessment: 1. Migraine without aura and without status migrainosus, not intractable   worsening   Plan: Will add nurtec for treatment of acute migraine.  This could be an option for prevention if we end up needing to d/c emgality Continue emgality for prevention Imitrex for acute migraine if nurtec ineffective.   Follow-up in 12 months or sooner PRN  Paticia Stack, PA-C 09/20/2019 9:42 AM

## 2019-09-23 ENCOUNTER — Encounter: Payer: Self-pay | Admitting: *Deleted

## 2019-10-07 ENCOUNTER — Ambulatory Visit: Payer: BC Managed Care – PPO | Admitting: Podiatry

## 2019-10-07 ENCOUNTER — Other Ambulatory Visit: Payer: Self-pay

## 2019-10-07 ENCOUNTER — Other Ambulatory Visit: Payer: Self-pay | Admitting: Internal Medicine

## 2019-10-07 ENCOUNTER — Encounter: Payer: Self-pay | Admitting: Podiatry

## 2019-10-07 DIAGNOSIS — M722 Plantar fascial fibromatosis: Secondary | ICD-10-CM

## 2019-10-07 DIAGNOSIS — Z1231 Encounter for screening mammogram for malignant neoplasm of breast: Secondary | ICD-10-CM

## 2019-10-07 MED ORDER — MELOXICAM 15 MG PO TABS: 15.0000 mg | ORAL_TABLET | Freq: Every day | ORAL | 3 refills | Status: DC

## 2019-10-07 NOTE — Progress Notes (Signed)
She presents today for follow-up for follow-up of her wound plantar fasciitis she states that she is doing well except she is started to recently develop a flareup she feels that it may have come after a foot massage.  Objective: Vital signs are stable she is alert and oriented x3.  She has pain on palpation medial calcaneal tubercle to less degree lateral calcaneal tubercle.  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  Plan: Discussed etiology pathology conservative surgical therapies at this point time went ahead and performed an injection to her right heel she tolerated procedure well 20 mg Kenalog 5 mg Marcaine point of maximal tenderness.  Follow-up with her on an as-needed basis.

## 2019-10-16 ENCOUNTER — Ambulatory Visit
Admission: RE | Admit: 2019-10-16 | Discharge: 2019-10-16 | Disposition: A | Discharge status: Home / Self Care | Payer: BC Managed Care – PPO | Source: Ambulatory Visit | Attending: Internal Medicine | Admitting: Internal Medicine

## 2019-10-16 ENCOUNTER — Other Ambulatory Visit: Payer: Self-pay

## 2019-10-16 DIAGNOSIS — Z1231 Encounter for screening mammogram for malignant neoplasm of breast: Secondary | ICD-10-CM

## 2019-11-20 ENCOUNTER — Encounter: Payer: Self-pay | Admitting: Dermatology

## 2019-11-20 ENCOUNTER — Other Ambulatory Visit: Payer: Self-pay

## 2019-11-20 ENCOUNTER — Ambulatory Visit: Payer: BC Managed Care – PPO | Admitting: Dermatology

## 2019-11-20 DIAGNOSIS — D1800 Hemangioma unspecified site: Secondary | ICD-10-CM

## 2019-11-20 DIAGNOSIS — L905 Scar conditions and fibrosis of skin: Secondary | ICD-10-CM

## 2019-11-20 DIAGNOSIS — L820 Inflamed seborrheic keratosis: Secondary | ICD-10-CM

## 2019-11-20 DIAGNOSIS — Z1283 Encounter for screening for malignant neoplasm of skin: Secondary | ICD-10-CM

## 2019-11-20 DIAGNOSIS — Z86018 Personal history of other benign neoplasm: Secondary | ICD-10-CM | POA: Diagnosis not present

## 2019-11-20 DIAGNOSIS — L821 Other seborrheic keratosis: Secondary | ICD-10-CM

## 2019-11-20 DIAGNOSIS — L814 Other melanin hyperpigmentation: Secondary | ICD-10-CM | POA: Diagnosis not present

## 2019-11-20 DIAGNOSIS — L578 Other skin changes due to chronic exposure to nonionizing radiation: Secondary | ICD-10-CM

## 2019-11-20 DIAGNOSIS — D229 Melanocytic nevi, unspecified: Secondary | ICD-10-CM

## 2019-11-20 NOTE — Patient Instructions (Addendum)

## 2019-11-20 NOTE — Progress Notes (Signed)
   Follow-Up Visit   Subjective  [name] is a 64 y.o. female who presents for the following: Annual Exam (1 year f/u hx of Dysplastic nevus ). Pt c/o irritated moles on her face and chest itchy and irritated she will sometimes scratch off.   The patient presents for Total-Body Skin Exam (TBSE) for skin cancer screening and mole check.  The following portions of the chart were reviewed this encounter and updated as appropriate:  Tobacco  Allergies  Meds  Problems  Med Hx  Surg Hx  Fam Hx     Review of Systems:  No other skin or systemic complaints except as noted in HPI or Assessment and Plan.  Objective  Well appearing patient in no apparent distress; mood and affect are within normal limits.  A full examination was performed including scalp, head, eyes, ears, nose, lips, neck, chest, axillae, abdomen, back, buttocks, bilateral upper extremities, bilateral lower extremities, hands, feet, fingers, toes, fingernails, and toenails. All findings within normal limits unless otherwise noted below.  Objective  face (10): Erythematous keratotic or waxy stuck-on papule or plaque.   Objective  multiple see history: Scar with no evidence of recurrence.   Objective  Left Shoulder - Anterior: Dyspigmented smooth macule or patch.    Assessment & Plan  Inflamed seborrheic keratosis (10) face  Destruction of lesion - face Complexity: simple   Destruction method: cryotherapy   Informed consent: discussed and consent obtained   Timeout:  patient name, date of birth, surgical site, and procedure verified Lesion destroyed using liquid nitrogen: Yes   Region frozen until ice ball extended beyond lesion: Yes   Outcome: patient tolerated procedure well with no complications   Post-procedure details: wound care instructions given    History of dysplastic nevus multiple see history  Clear. Observe for recurrence. Call clinic for new or changing lesions.  Recommend regular skin  exams, daily broad-spectrum spf 30+ sunscreen use, and photoprotection.     Scar Left Shoulder - Anterior  Hx  of Dermatofibroma   Skin cancer screening  Lentigines - Scattered tan macules - Discussed due to sun exposure - Benign, observe - Call for any changes  Seborrheic Keratoses - Stuck-on, waxy, tan-brown papules and plaques  - Discussed benign etiology and prognosis. - Observe - Call for any changes  Melanocytic Nevi - Tan-brown and/or pink-flesh-colored symmetric macules and papules - Benign appearing on exam today - Observation - Call clinic for new or changing moles - Recommend daily use of broad spectrum spf 30+ sunscreen to sun-exposed areas.   Hemangiomas - Red papules - Discussed benign nature - Observe - Call for any changes  Actinic Damage - diffuse scaly erythematous macules with underlying dyspigmentation - Recommend daily broad spectrum sunscreen SPF 30+ to sun-exposed areas, reapply every 2 hours as needed.  - Call for new or changing lesions.  Skin cancer screening performed today.  Return in about 1 year (around 11/19/2020) for TBSE .  IMarye Round, CMA, am acting as scribe for Sarina Ser, MD .  Documentation: I have reviewed the above documentation for accuracy and completeness, and I agree with the above.  Sarina Ser, MD

## 2019-11-21 ENCOUNTER — Encounter: Payer: Self-pay | Admitting: Dermatology

## 2019-12-04 ENCOUNTER — Other Ambulatory Visit: Payer: Self-pay | Admitting: Podiatry

## 2019-12-04 MED ORDER — MELOXICAM 15 MG PO TABS: 15.0000 mg | ORAL_TABLET | Freq: Every day | ORAL | 3 refills | Status: DC

## 2019-12-04 NOTE — Telephone Encounter (Signed)
Please advise 

## 2020-03-11 IMAGING — CR CHEST - 2 VIEW
1 series · 2 of 2 positions shown · non-contrast
Comparison: Chest x-ray 06/13/2018.

CLINICAL DATA: 62-year-old female with history of productive cough
since [REDACTED].

EXAM:
CHEST - 2 VIEW

[Series 1: dg chest 2 view · 0.14mm/px · 2 of 2 slices shown]
[im 1/2]
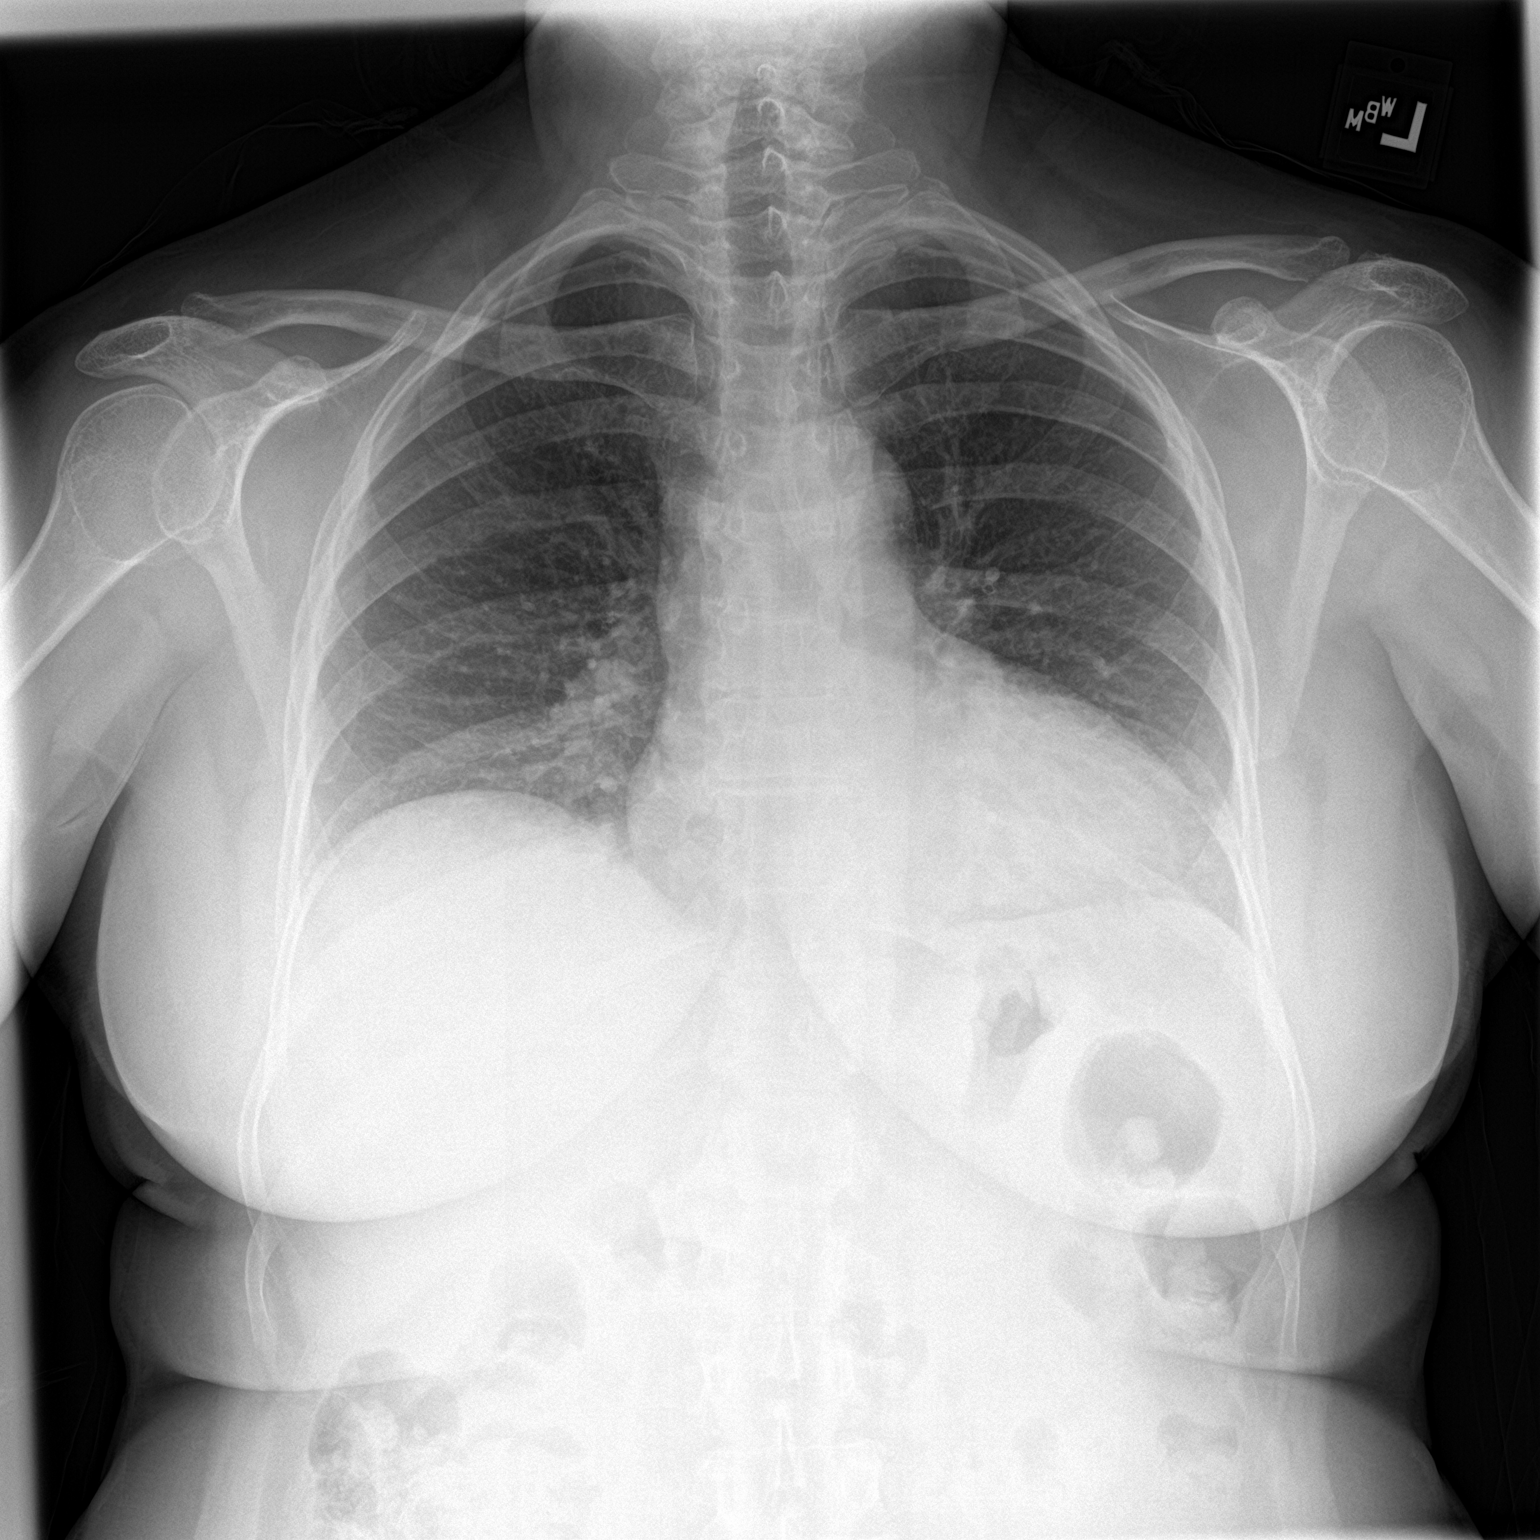
[im 2/2]
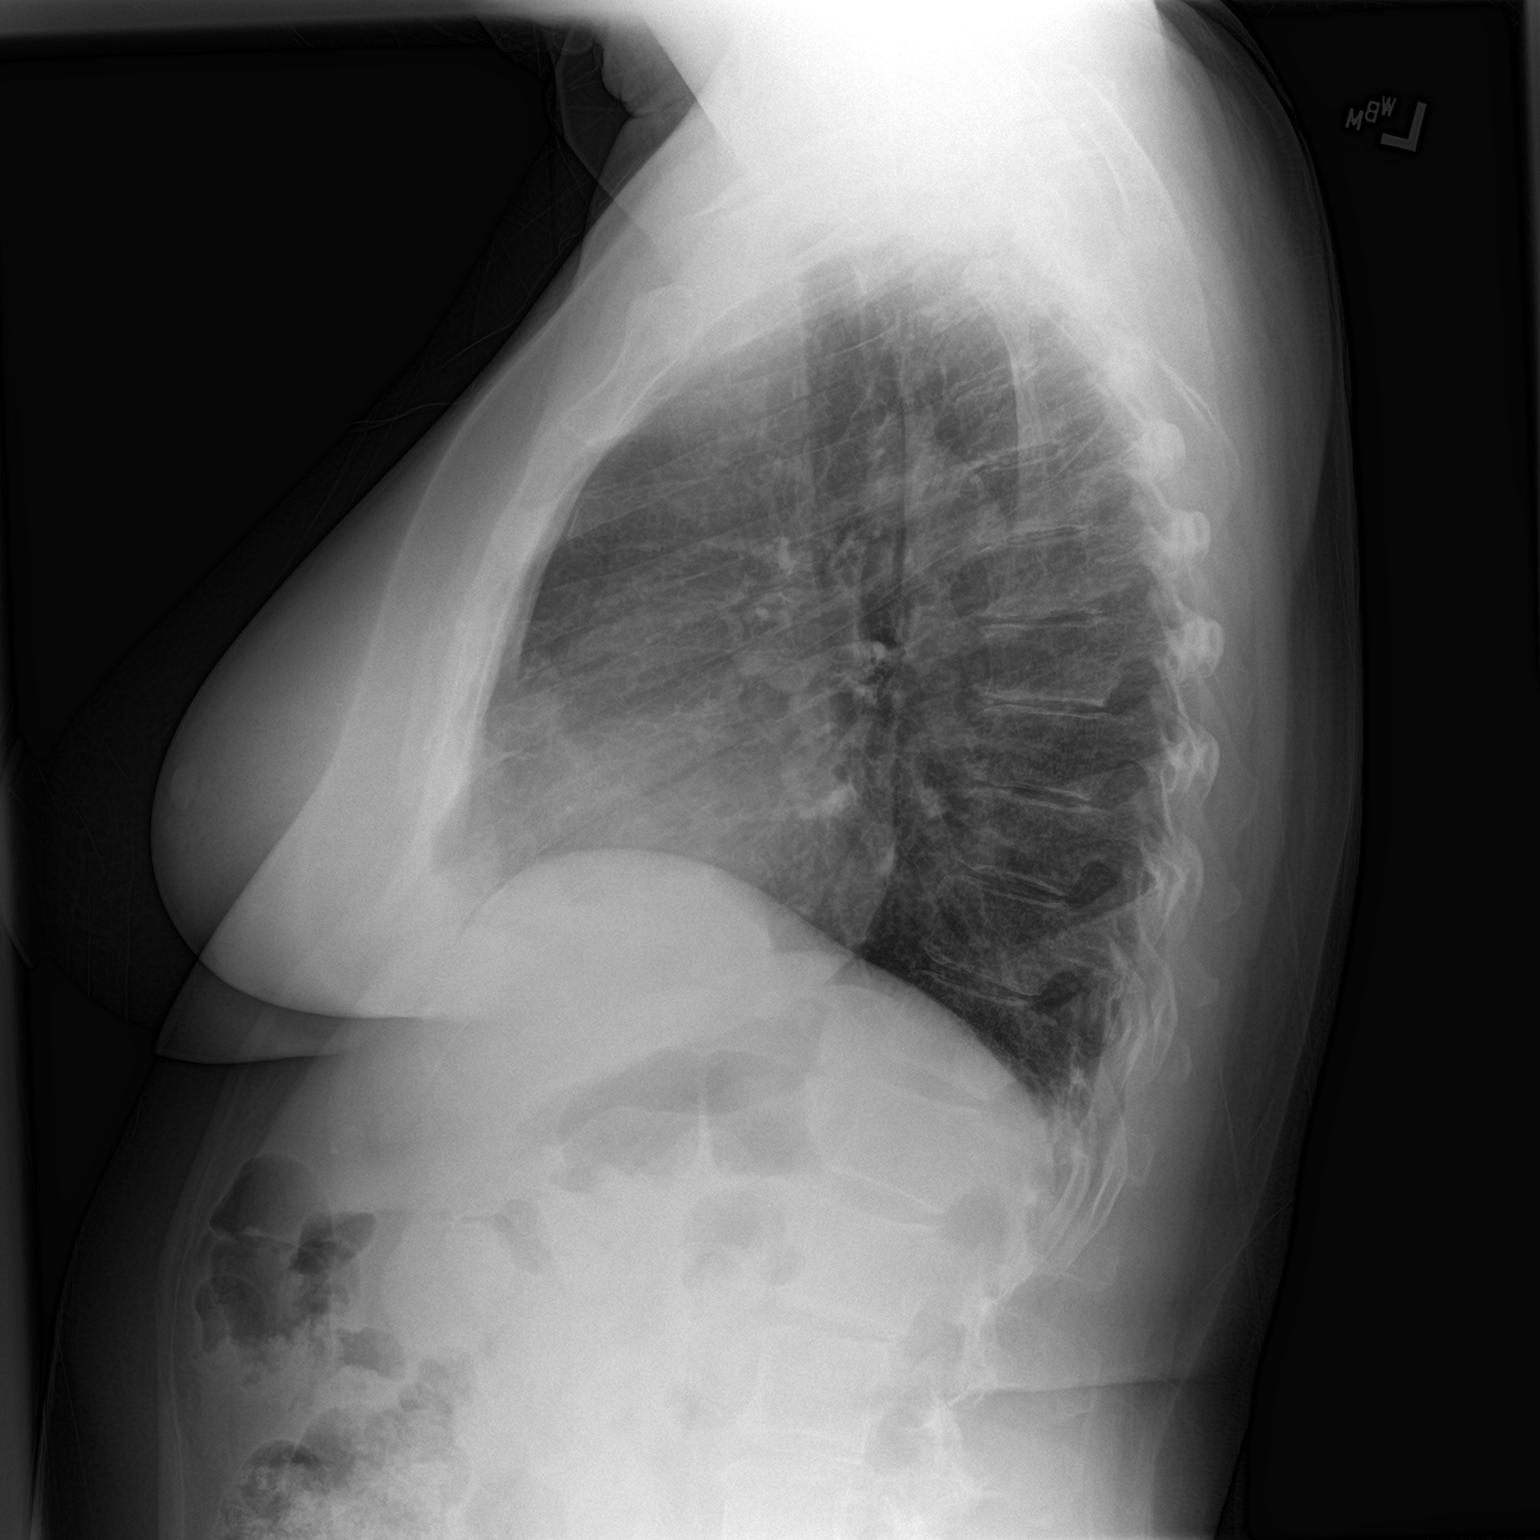

[2 of 2 positions shown; findings below may reference images not displayed]

FINDINGS: Lung volumes are normal. No consolidative airspace disease. No
pleural effusions. No pneumothorax. No pulmonary nodule or mass
noted. Pulmonary vasculature and the cardiomediastinal silhouette
are within normal limits.
IMPRESSION: No radiographic evidence of acute cardiopulmonary disease.

## 2020-03-25 ENCOUNTER — Encounter: Payer: Self-pay | Admitting: *Deleted

## 2020-04-25 IMAGING — CT CT MAXILLOFACIAL WITHOUT CONTRAST
3 of 5 series · 14 of 47 positions shown, 16 images · non-contrast
Comparison: Brain MRI 02/24/2016

CLINICAL DATA: Sinus problems for 5 months

EXAM:
CT MAXILLOFACIAL WITHOUT CONTRAST
TECHNIQUE: Multidetector CT images of the paranasal sinuses were obtained using
the standard protocol without intravenous contrast.

[Series 6: coroanl sinus · coronal · 0.18mm/px · 3 of 67 slices shown]
[im 23/67  bone]
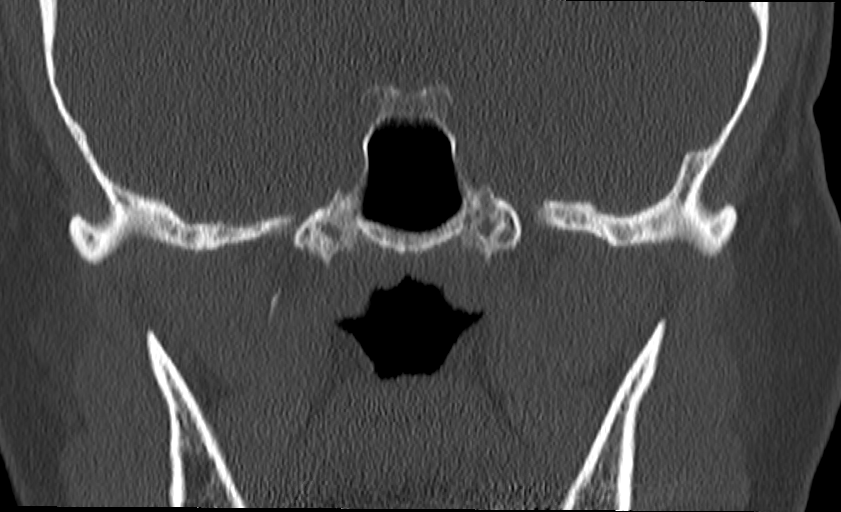
[im 30/67  bone]
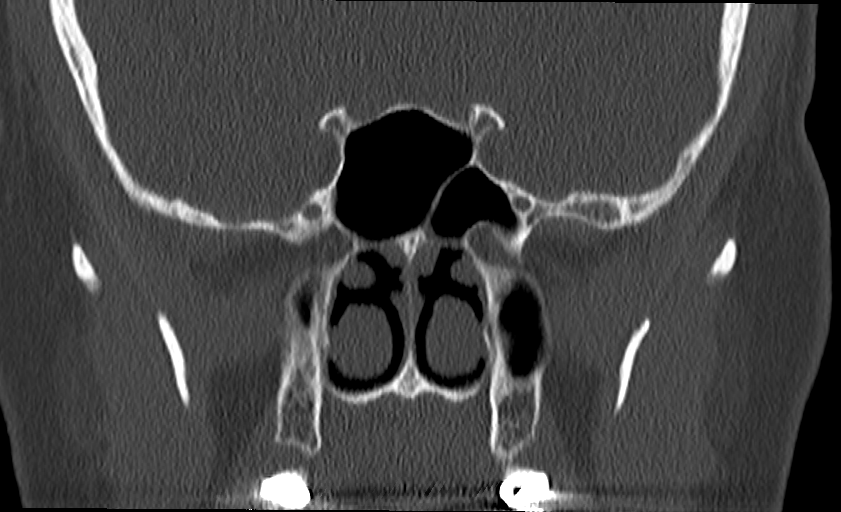
[im 37/67  bone]
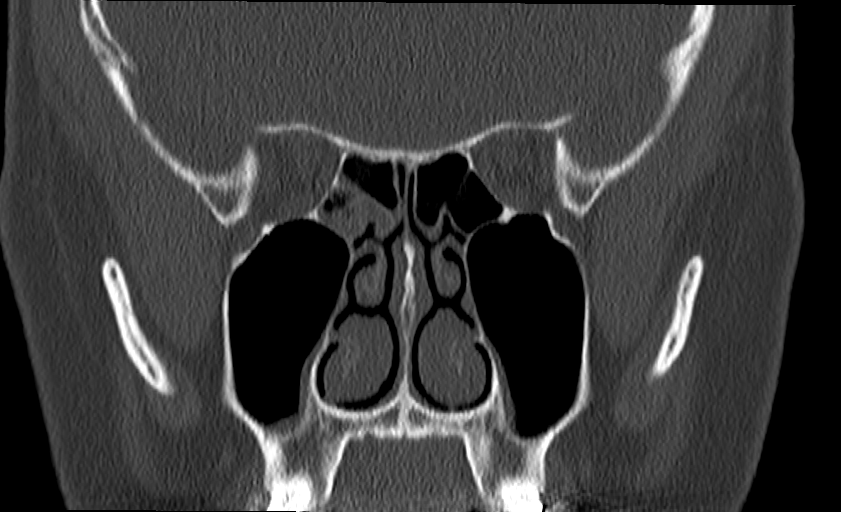

[Series 8: sagittal sinus · sagittal · 0.18mm/px · 3 of 73 slices shown]
[im 25/73  bone]
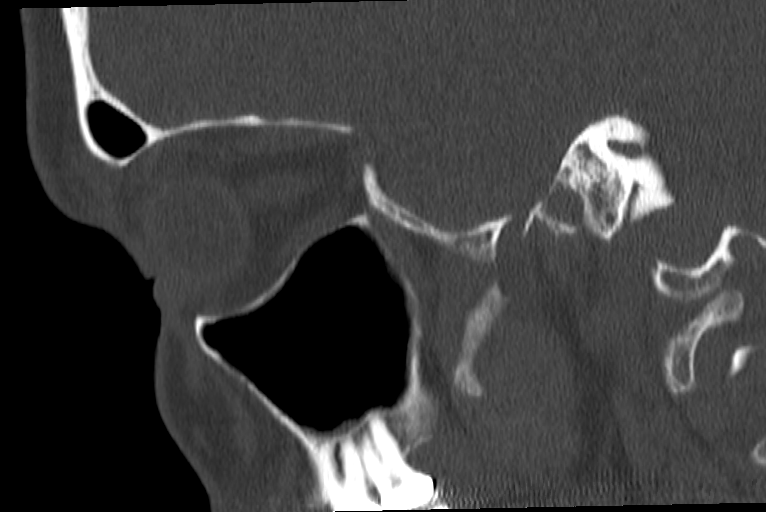
[im 37/73  bone]
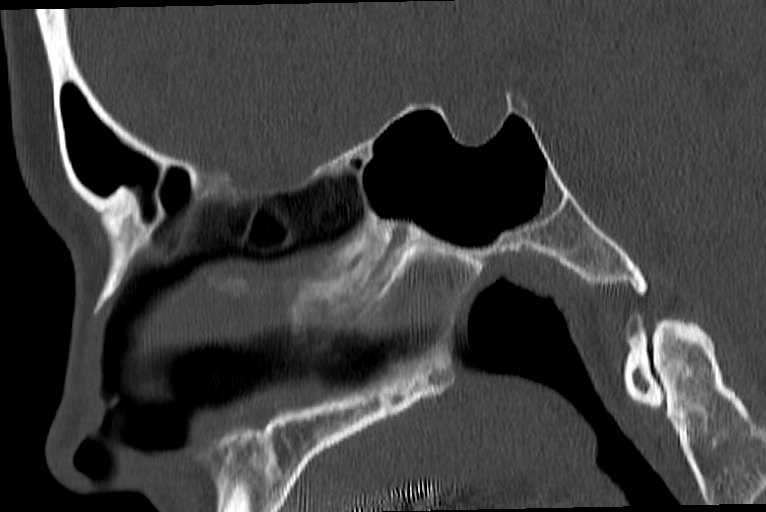
[im 49/73  bone]
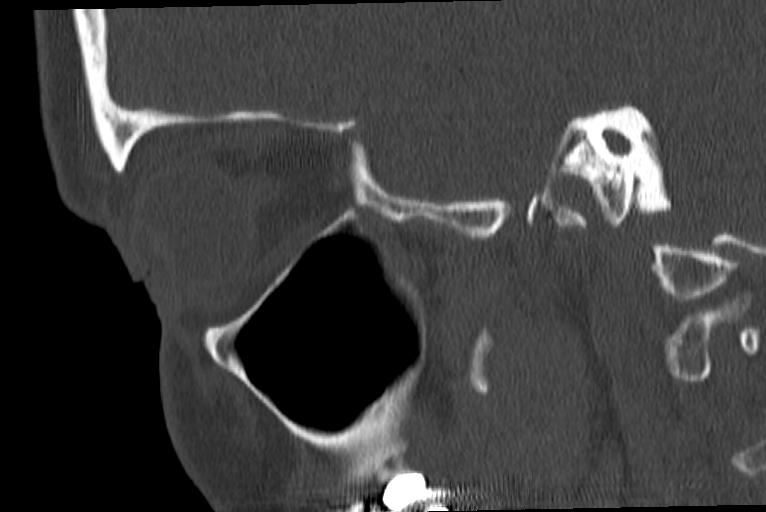

[Series 10: thins sinus · axial · 0.26mm/px · z∈[-582,-508]mm · 8 of 88 slices shown, 10 images]
[im 7/88  brain]
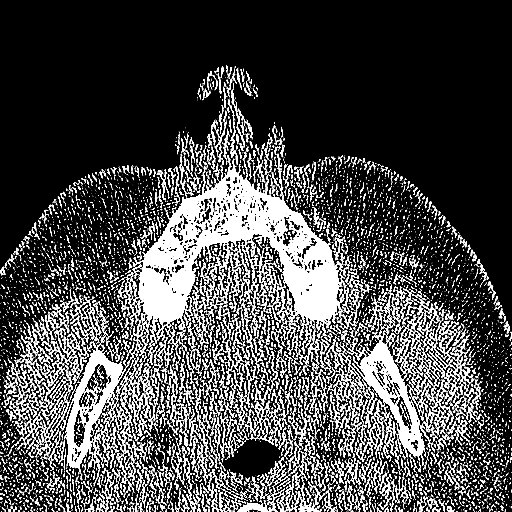
[im 7/88  bone]
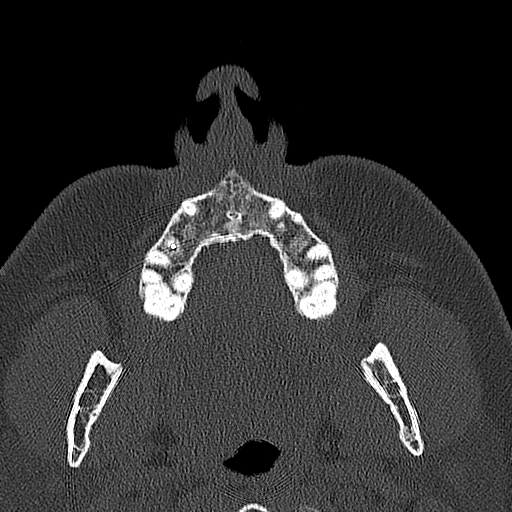
[im 19/88  bone]
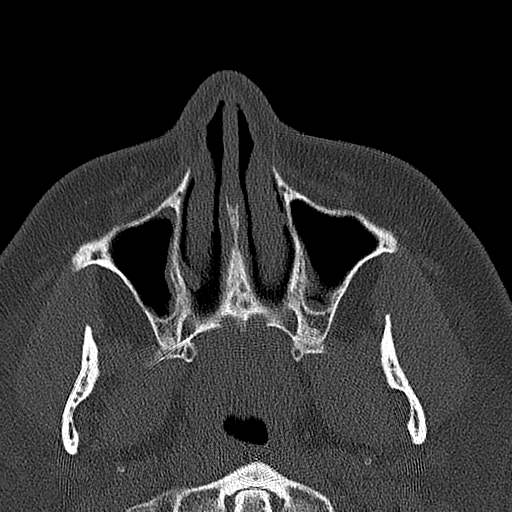
[im 32/88  bone]
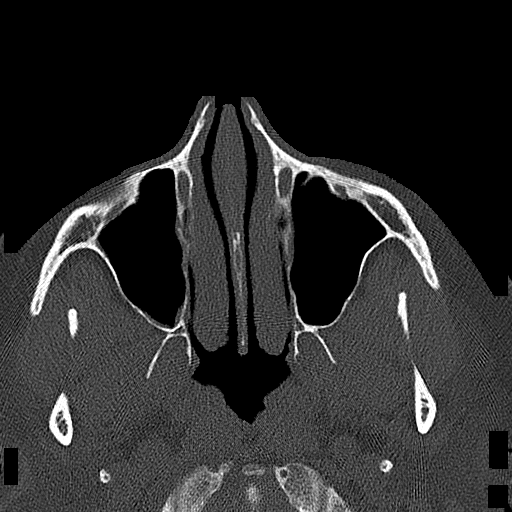
[im 38/88  bone]
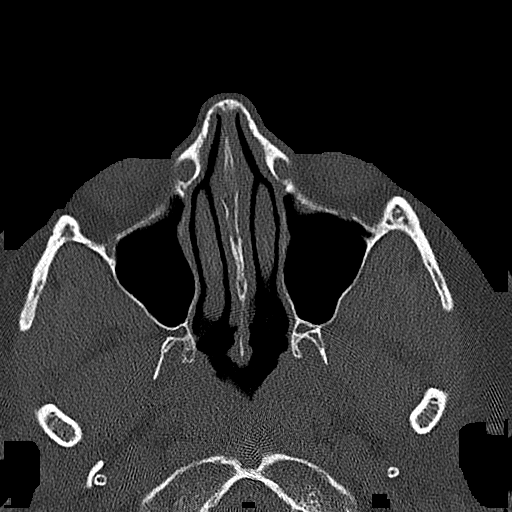
[im 50/88  brain]
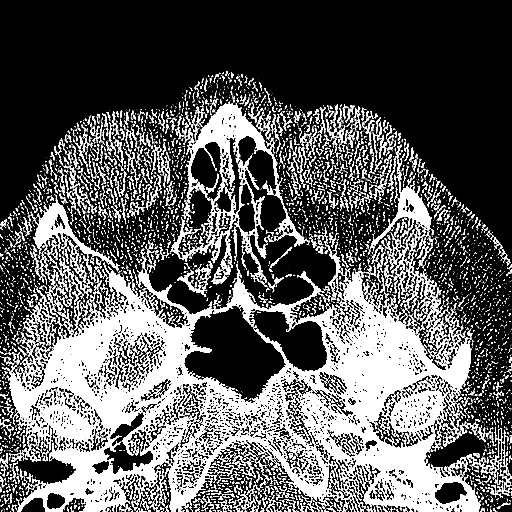
[im 50/88  bone]
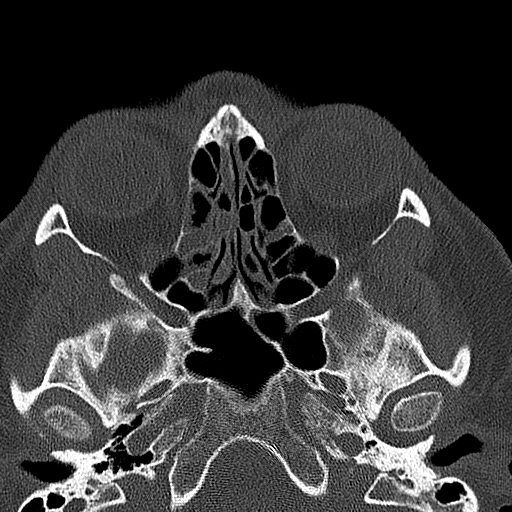
[im 56/88  bone]
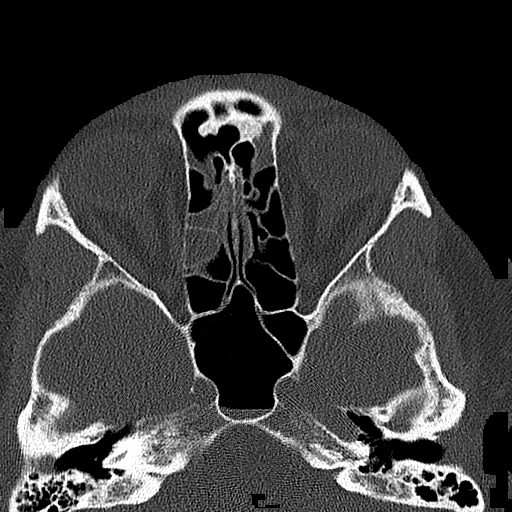
[im 69/88  bone]
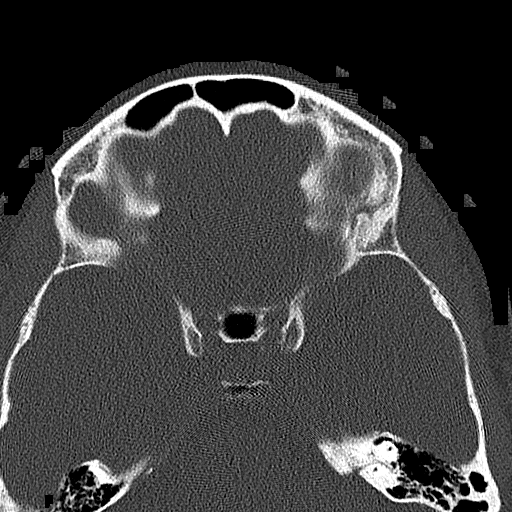
[im 81/88  bone]
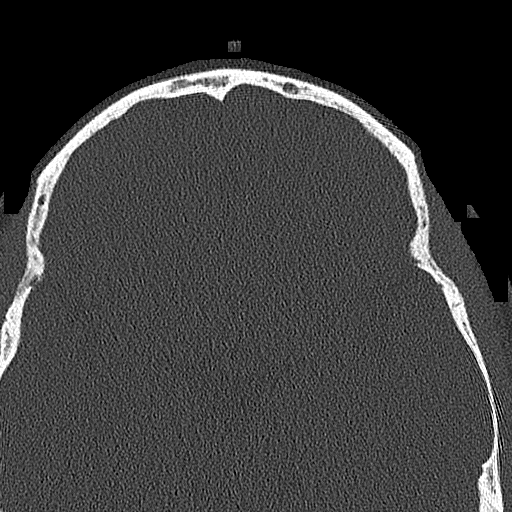

[14 of 47 positions shown; findings below may reference images not displayed]

FINDINGS: Paranasal sinuses:

Frontal: Completely opacified hypoplastic left frontal sinus. The
large right frontal sinus crosses midline and is clear.

Ethmoid: Moderate patchy opacification on both sides with dense and
frothy opacification.

Maxillary: Minor mucosal thickening on both sides.

Sphenoid: Small fluid level in the right sphenoid sinus. Both
sphenoid ostia are not visible, but the sphenoid ethmoidal recesses
are patent

Right ostiomeatal unit: Narrow infundibulum due to uncinate
positioning and mild mucosal thickening. Patent middle meatus

Left ostiomeatal unit: Patent with mild narrowing from mucosal
thickening.

Nasal passages: Patent. Intact nasal septum is midline.

Anatomy: No pneumatization superior to anterior ethmoid notches.
Sellar sphenoid pneumatization pattern. No dehiscence of carotid or
optic canals. No onodi cell.

Other: Minimal right mastoid opacification with negative
nasopharynx.
IMPRESSION: 1.  Moderate patchy bilateral ethmoid sinusitis.
2. Hypoplastic left frontal sinus with complete opacification that
was also seen in 3476.
3. Small right sphenoid fluid level.

## 2020-05-07 ENCOUNTER — Other Ambulatory Visit: Payer: Self-pay

## 2020-05-07 ENCOUNTER — Ambulatory Visit: Payer: BC Managed Care – PPO | Admitting: Dermatology

## 2020-05-07 DIAGNOSIS — M67449 Ganglion, unspecified hand: Secondary | ICD-10-CM

## 2020-05-07 DIAGNOSIS — L820 Inflamed seborrheic keratosis: Secondary | ICD-10-CM

## 2020-05-07 DIAGNOSIS — L578 Other skin changes due to chronic exposure to nonionizing radiation: Secondary | ICD-10-CM | POA: Diagnosis not present

## 2020-05-07 DIAGNOSIS — L821 Other seborrheic keratosis: Secondary | ICD-10-CM | POA: Diagnosis not present

## 2020-05-07 DIAGNOSIS — M67471 Ganglion, right ankle and foot: Secondary | ICD-10-CM

## 2020-05-07 NOTE — Progress Notes (Signed)
   Follow-Up Visit   Subjective  [name] is a 65 y.o. female who presents for the following: Other (Spot on right upper arm that came up ~1 month ago and is scaly).  The following portions of the chart were reviewed this encounter and updated as appropriate:   Tobacco  Allergies  Meds  Problems  Med Hx  Surg Hx  Fam Hx     Review of Systems:  No other skin or systemic complaints except as noted in HPI or Assessment and Plan.  Objective  Well appearing patient in no apparent distress; mood and affect are within normal limits.  A focused examination was performed including right arm, right foot. Relevant physical exam findings are noted in the Assessment and Plan.  Objective  Right great toe: Flesh colored papule  Objective  Right upper arm: Erythematous keratotic or waxy stuck-on papule or plaque.    Assessment & Plan    Seborrheic Keratoses - Stuck-on, waxy, tan-brown papules and/or plaques  - Benign-appearing - Discussed benign etiology and prognosis. - Observe - Call for any changes  Digital mucous cyst Right great toe Benign appearing. Observe. Discussed conditions and treatment options. No treatment recommended at this time unless it gets larger or becomes symptomatic. Discussed IL injections vs Orthopedic surgery referral.  Pt declines at this time.  Inflamed seborrheic keratosis Right upper arm Destruction of lesion - Right upper arm Complexity: simple   Destruction method: cryotherapy   Informed consent: discussed and consent obtained   Timeout:  patient name, date of birth, surgical site, and procedure verified Lesion destroyed using liquid nitrogen: Yes   Region frozen until ice ball extended beyond lesion: Yes   Outcome: patient tolerated procedure well with no complications   Post-procedure details: wound care instructions given    Actinic Damage - chronic, secondary to cumulative UV radiation exposure/sun exposure over time - diffuse  scaly erythematous macules with underlying dyspigmentation - Recommend daily broad spectrum sunscreen SPF 30+ to sun-exposed areas, reapply every 2 hours as needed.  - Recommend staying in the shade or wearing long sleeves, sun glasses (UVA+UVB protection) and wide brim hats (4-inch brim around the entire circumference of the hat). - Call for new or changing lesions.  Return for Follow up as scheduled.  I, Ashok Cordia, CMA, am acting as scribe for Sarina Ser, MD .  Documentation: I have reviewed the above documentation for accuracy and completeness, and I agree with the above.  Sarina Ser, MD

## 2020-05-07 NOTE — Patient Instructions (Signed)

## 2020-05-13 ENCOUNTER — Encounter: Payer: Self-pay | Admitting: Dermatology

## 2020-06-12 ENCOUNTER — Other Ambulatory Visit: Payer: Self-pay | Admitting: *Deleted

## 2020-06-12 DIAGNOSIS — G43009 Migraine without aura, not intractable, without status migrainosus: Secondary | ICD-10-CM

## 2020-06-12 MED ORDER — SERTRALINE HCL 100 MG PO TABS: 100.0000 mg | ORAL_TABLET | Freq: Every day | ORAL | 4 refills | Status: DC

## 2020-08-20 ENCOUNTER — Telehealth: Payer: Self-pay | Admitting: Radiology

## 2020-08-20 NOTE — Telephone Encounter (Signed)
Left message for patient to call [facility] to schedule September appointment with Allie Dimmer

## 2020-09-15 ENCOUNTER — Other Ambulatory Visit: Payer: Self-pay | Admitting: Obstetrics and Gynecology

## 2020-09-15 DIAGNOSIS — Z1231 Encounter for screening mammogram for malignant neoplasm of breast: Secondary | ICD-10-CM

## 2020-09-21 ENCOUNTER — Other Ambulatory Visit: Payer: Self-pay | Admitting: Physician Assistant

## 2020-09-21 ENCOUNTER — Encounter: Payer: Self-pay | Admitting: *Deleted

## 2020-10-11 ENCOUNTER — Other Ambulatory Visit: Payer: Self-pay | Admitting: Physician Assistant

## 2020-10-11 DIAGNOSIS — G43009 Migraine without aura, not intractable, without status migrainosus: Secondary | ICD-10-CM

## 2020-10-16 ENCOUNTER — Ambulatory Visit: Payer: BC Managed Care – PPO | Admitting: Physician Assistant

## 2020-10-16 ENCOUNTER — Encounter: Payer: Self-pay | Admitting: Physician Assistant

## 2020-10-16 ENCOUNTER — Other Ambulatory Visit: Payer: Self-pay

## 2020-10-16 VITALS — BP 135/87 | HR 67 | Wt 168.0 lb

## 2020-10-16 DIAGNOSIS — G43101 Migraine with aura, not intractable, with status migrainosus: Secondary | ICD-10-CM | POA: Diagnosis not present

## 2020-10-16 DIAGNOSIS — G43009 Migraine without aura, not intractable, without status migrainosus: Secondary | ICD-10-CM | POA: Diagnosis not present

## 2020-10-16 MED ORDER — SUMATRIPTAN SUCCINATE 100 MG PO TABS: ORAL_TABLET | ORAL | 11 refills | Status: DC

## 2020-10-16 MED ORDER — SERTRALINE HCL 100 MG PO TABS: 100.0000 mg | ORAL_TABLET | Freq: Every day | ORAL | 11 refills | Status: DC

## 2020-10-16 MED ORDER — METOPROLOL TARTRATE 25 MG PO TABS: 25.0000 mg | ORAL_TABLET | Freq: Every day | ORAL | 0 refills | Status: DC

## 2020-10-16 MED ORDER — ONDANSETRON HCL 8 MG PO TABS: 8.0000 mg | ORAL_TABLET | Freq: Three times a day (TID) | ORAL | 0 refills | Status: DC | PRN

## 2020-10-16 NOTE — Progress Notes (Signed)
History:  Christine TIPPETTS is a 65 y.o. G2P0002 who presents to clinic today for headache management.  She has not used emgality in many months but her headches are still adequately controlled.   Nurtec did not help.   She continues to use sumatriptan with good results.   Number of days in the last 4 weeks with:  Severe headache: 0 Moderate headache: 5 Mild headache: 1  No headache: 22   Past Medical History:  Diagnosis Date   AR (allergic rhinitis)    Dysplastic nevus 06/14/2007   Left lateral back braline, slight   Dysplastic nevus 03/18/2009   R mid to low back paraspinal, excision, mod-severe   IBS (irritable bowel syndrome)    Migraines    1-3x/wk   Osteoarthritis    Osteopenia     Social History   Socioeconomic History   Marital status: Married    Spouse name: Not on file   Number of children: 2   Years of education: Not on file   Highest education level: Not on file  Occupational History   Occupation: Treasurer/Secretary    Comment: [employer]   Tobacco Use   Smoking status: Former    Years: 12.00    Types: Cigarettes    Quit date: 01/24/1988    Years since quitting: 32.7   Smokeless tobacco: Never  Vaping Use   Vaping Use: Never used  Substance and Sexual Activity   Alcohol use: Yes    Alcohol/week: 3.0 standard drinks    Types: 1 Cans of beer, 2 Standard drinks or equivalent per week    Comment: Rare   Drug use: No   Sexual activity: Yes    Partners: Male  Other Topics Concern   Not on file  Social History Narrative   Not on file   Social Determinants of Health   Financial Resource Strain: Not on file  Food Insecurity: Not on file  Transportation Needs: Not on file  Physical Activity: Not on file  Stress: Not on file  Social Connections: Not on file  Intimate Partner Violence: Not on file    Family History  Problem Relation Age of Onset   Macular degeneration Mother    Hypertension Father    Diabetes Father    Hypertension Sister     Cerebral aneurysm Sister    Cancer Maternal Aunt        breast    Diabetes Paternal Grandfather    Cerebral aneurysm Brother    Colon cancer Neg Hx    Esophageal cancer Neg Hx    Stomach cancer Neg Hx    Rectal cancer Neg Hx     Allergies  Allergen Reactions   Codeine     Current Outpatient Medications on File Prior to Visit  Medication Sig Dispense Refill   azelastine (ASTELIN) 0.1 % nasal spray Place into both nostrils.     estradiol (ESTRACE) 1 MG tablet Take 1 mg by mouth daily.     meloxicam (MOBIC) 15 MG tablet TAKE 1 TABLET(15 MG) BY MOUTH DAILY 30 tablet 3   progesterone (PROMETRIUM) 200 MG capsule progesterone micronized 200 mg capsule     sertraline (ZOLOFT) 100 MG tablet Take 1 tablet (100 mg total) by mouth daily. 30 tablet 4   SUMAtriptan (IMITREX) 100 MG tablet May repeat in 2 hours if headache persists or recurs. 12 tablet 11   fluticasone (FLONASE) 50 MCG/ACT nasal spray Place 2 sprays into both nostrils daily. (Patient not taking: Reported on  07/09/2019)     fluticasone furoate-vilanterol (BREO ELLIPTA) 100-25 MCG/INH AEPB Inhale 1 puff into the lungs daily. (Patient not taking: Reported on 07/24/2018) 60 each 5   Galcanezumab-gnlm (EMGALITY) 120 MG/ML SOAJ INJECT 240MG (2 SYRINGES) INTO THE SKIN ONCE FOR 1ST DOSE, THEN 120MG (1 SYRINGE) MONTHLY 1 mL 11   levocetirizine (XYZAL) 5 MG tablet Take 1 tablet (5 mg total) by mouth every evening. 30 tablet 2   montelukast (SINGULAIR) 10 MG tablet Take 10 mg by mouth daily.     NURTEC 75 MG TBDP DISSOLVE ONE TABLET BY MOUTH AS NEEDED 8 tablet 11   omeprazole (PRILOSEC) 20 MG capsule Take 20 mg by mouth daily.     No current facility-administered medications on file prior to visit.     Review of Systems:  All pertinent positive/negative included in HPI, all other review of systems are negative   Objective:  Physical Exam BP 135/87   Pulse 67   Wt 168 lb (76.2 kg)   BMI 29.29 kg/m  CONSTITUTIONAL: Well-developed,  well-nourished female in no acute distress.  EYES: EOM intact ENT: Normocephalic CARDIOVASCULAR: Regular rate RESPIRATORY: Normal rate.  MUSCULOSKELETAL: Normal ROM SKIN: Warm, dry without erythema  NEUROLOGICAL: Alert, oriented, CN II-XII grossly intact, Appropriate balance PSYCH: Normal behavior, mood   Assessment & Plan:  Assessment: 1. Migraine with aura and with status migrainosus, not intractable   2. Migraine without aura and without status migrainosus, not intractable      Plan: Continue triptan ONLY IF BP STABLE Begin metoprolol 1/2 to 1 tab daily for migraine prevention Trial of Ubrelvy for acute migraine.  Can replace sumatriptan if it is effective F/u with PCP for bp and routine monitoring Follow-up in 12 months or sooner PRN  Paticia Stack, PA-C 10/16/2020 9:12 AM

## 2020-10-18 ENCOUNTER — Other Ambulatory Visit: Payer: Self-pay | Admitting: Physician Assistant

## 2020-10-18 DIAGNOSIS — G43009 Migraine without aura, not intractable, without status migrainosus: Secondary | ICD-10-CM

## 2020-10-21 ENCOUNTER — Other Ambulatory Visit: Payer: Self-pay

## 2020-10-21 ENCOUNTER — Ambulatory Visit
Admission: RE | Admit: 2020-10-21 | Discharge: 2020-10-21 | Disposition: A | Discharge status: Home / Self Care | Payer: BC Managed Care – PPO | Source: Ambulatory Visit | Attending: Obstetrics and Gynecology | Admitting: Obstetrics and Gynecology

## 2020-10-21 DIAGNOSIS — Z1231 Encounter for screening mammogram for malignant neoplasm of breast: Secondary | ICD-10-CM

## 2020-11-12 ENCOUNTER — Other Ambulatory Visit: Payer: Self-pay | Admitting: Physician Assistant

## 2020-11-25 ENCOUNTER — Encounter: Payer: Self-pay | Admitting: Dermatology

## 2020-11-26 ENCOUNTER — Other Ambulatory Visit: Payer: Self-pay

## 2020-11-26 ENCOUNTER — Ambulatory Visit: Payer: BC Managed Care – PPO | Admitting: Dermatology

## 2020-11-26 DIAGNOSIS — L820 Inflamed seborrheic keratosis: Secondary | ICD-10-CM | POA: Diagnosis not present

## 2020-11-26 DIAGNOSIS — L570 Actinic keratosis: Secondary | ICD-10-CM | POA: Diagnosis not present

## 2020-11-26 DIAGNOSIS — D229 Melanocytic nevi, unspecified: Secondary | ICD-10-CM

## 2020-11-26 DIAGNOSIS — L578 Other skin changes due to chronic exposure to nonionizing radiation: Secondary | ICD-10-CM

## 2020-11-26 DIAGNOSIS — D2362 Other benign neoplasm of skin of left upper limb, including shoulder: Secondary | ICD-10-CM

## 2020-11-26 DIAGNOSIS — Z1283 Encounter for screening for malignant neoplasm of skin: Secondary | ICD-10-CM

## 2020-11-26 DIAGNOSIS — D235 Other benign neoplasm of skin of trunk: Secondary | ICD-10-CM

## 2020-11-26 DIAGNOSIS — L814 Other melanin hyperpigmentation: Secondary | ICD-10-CM

## 2020-11-26 DIAGNOSIS — L821 Other seborrheic keratosis: Secondary | ICD-10-CM

## 2020-11-26 DIAGNOSIS — Z86018 Personal history of other benign neoplasm: Secondary | ICD-10-CM

## 2020-11-26 DIAGNOSIS — D1800 Hemangioma unspecified site: Secondary | ICD-10-CM

## 2020-11-26 NOTE — Patient Instructions (Addendum)
Wart removal cream   Instructions for [facility] Medications  One or more of your medications was sent to the [facility] mail order compounding pharmacy. You will receive an email from them and can purchase the medicine through that link. It will then be mailed to your home at the address you confirmed. If for any reason you do not receive an email from them, please check your spam folder. If you still do not find the email, please let us know. [facility] phone number is [phone].   Cryotherapy Aftercare  Wash gently with soap and water everyday.   Apply Vaseline and Band-Aid daily until healed.  Prior to procedure, discussed risks of blister formation, small wound, skin dyspigmentation, or rare scar following cryotherapy. Recommend Vaseline ointment to treated areas while healing.    If you have any questions or concerns for your doctor, please call our main line at [phone] and press option 4 to reach your doctor's medical assistant. If no one answers, please leave a voicemail as directed and we will return your call as soon as possible. Messages left after 4 pm will be answered the following business day.   You may also send [place] a message via [place]. We typically respond to MyChart messages within 1-2 business days.  For prescription refills, please ask your pharmacy to contact our office. Our fax number is [phone].  If you have an urgent issue when the clinic is closed that cannot wait until the next business day, you can page your doctor at the number below.    Please note that while we do our best to be available for urgent issues outside of office hours, we are not available 24/7.   If you have an urgent issue and are unable to reach [place], you may choose to seek medical care at your doctor's office, retail clinic, urgent care center, or emergency room.  If you have a medical emergency, please immediately call 911 or go to the emergency  department.  Pager Numbers  - Dr. Nehemiah Massed: [phone]  - Dr. Laurence Ferrari: [phone]  - Dr. Nicole Kindred: [phone]  In the event of inclement weather, please call our main line at [phone] for an update on the status of any delays or closures.  Dermatology Medication Tips: Please keep the boxes that topical medications come in in order to help keep track of the instructions about where and how to use these. Pharmacies typically print the medication instructions only on the boxes and not directly on the medication tubes.   If your medication is too expensive, please contact our office at [phone] option 4 or send [place] a message through [place].   We are unable to tell what your co-pay for medications will be in advance as this is different depending on your insurance coverage. However, we may be able to find a substitute medication at lower cost or fill out paperwork to get insurance to cover a needed medication.   If a prior authorization is required to get your medication covered by your insurance company, please allow [place] 1-2 business days to complete this process.  Drug prices often vary depending on where the prescription is filled and some pharmacies may offer cheaper prices.  The website www.goodrx.com contains coupons for medications through different pharmacies. The prices here do not account for what the cost may be with help from insurance (it may be cheaper with your insurance), but the website can give you the price if you did not use  any insurance.  - You can print the associated coupon and take it with your prescription to the pharmacy.  - You may also stop by our office during regular business hours and pick up a GoodRx coupon card.  - If you need your prescription sent electronically to a different pharmacy, notify our office through [facility] or by phone at [phone] option 4.

## 2020-11-26 NOTE — Progress Notes (Signed)
Follow-Up Visit   Subjective  [name] is a 65 y.o. female who presents for the following: Annual Exam (Mole check ). Yearly mole check hx of Dysplastic nevus.  The patient presents for Total-Body Skin Exam (TBSE) for skin cancer screening and mole check.   The following portions of the chart were reviewed this encounter and updated as appropriate:   Tobacco  Allergies  Meds  Problems  Med Hx  Surg Hx  Fam Hx     Review of Systems:  No other skin or systemic complaints except as noted in HPI or Assessment and Plan.  Objective  Well appearing patient in no apparent distress; mood and affect are within normal limits.  A full examination was performed including scalp, head, eyes, ears, nose, lips, neck, chest, axillae, abdomen, back, buttocks, bilateral upper extremities, bilateral lower extremities, hands, feet, fingers, toes, fingernails, and toenails. All findings within normal limits unless otherwise noted below.  left forehead x 1 Erythematous thin papules/macules with gritty scale.   back, arms, legs Stuck-on, waxy, tan-brown papule or plaque --Discussed benign etiology and prognosis.   ankles x 40, back, arms, legs x 15 (55) Erythematous keratotic or waxy stuck-on papule or plaque.    Assessment & Plan  AK (actinic keratosis) left forehead x 1 Actinic keratoses are precancerous spots that appear secondary to cumulative UV radiation exposure/sun exposure over time. They are chronic with expected duration over 1 year. A portion of actinic keratoses will progress to squamous cell carcinoma of the skin. It is not possible to reliably predict which spots will progress to skin cancer and so treatment is recommended to prevent development of skin cancer.  Recommend daily broad spectrum sunscreen SPF 30+ to sun-exposed areas, reapply every 2 hours as needed.  Recommend staying in the shade or wearing long sleeves, sun glasses (UVA+UVB protection) and wide brim hats  (4-inch brim around the entire circumference of the hat). Call for new or changing lesions.   Prior to procedure, discussed risks of blister formation, small wound, skin dyspigmentation, or rare scar following cryotherapy. Recommend Vaseline ointment to treated areas while healing.   Destruction of lesion - left forehead x 1 Complexity: simple   Destruction method: cryotherapy   Informed consent: discussed and consent obtained   Timeout:  patient name, date of birth, surgical site, and procedure verified Lesion destroyed using liquid nitrogen: Yes   Region frozen until ice ball extended beyond lesion: Yes   Outcome: patient tolerated procedure well with no complications   Post-procedure details: wound care instructions given    Seborrheic keratosis -more growing all the time.  They are symptomatic and patient wonders if there is any treatment she can do at home. back, arms, legs Reassured benign age-related growth.  Recommend observation.  Discussed cryotherapy if spot(s) become irritated or inflamed.  Start Skin medicinals wart paste spot treat apply to affected area at bedtime and cover with tape.   WARTPEEL INSTRUCTIONS FOR TREATING SEBORRHEIC KERATOSES  WartPEEL is a special compounded prescription medicine (5-fluorouracil/salicylic acid) that can be used to treat seborrheic keratoses ("Wisdom Spots"). It is not paid for by insurance. To obtain your medication, please call the pharmacy below, and they will ship it to your house.  - It should be applied to the raised seborrheic keratosis once a day at night and covered with a bandage. Do not apply it to completely flat spots (spots you cannot feel). - Care should be taken to avoid applying it to the normal  skin in order to avoid irritation.  - It should not be used by pregnant women.  - It should not be used for more than 4 days at a time. - You should stop applying it immediately if you develop pain or more than mild irritation at the  application area.   WartPEEL can cause scarring if it is applied to normal skin or if you continue applying it after the seborrheic keratosis has already been adequately treated. Do not use this on moles or any other spots your physician did not specifically recommend you treat.   WartPeel can be filled only at a [employer] [facility] [employer]. Please call the pharmacy below to fill your prescription. Once they confirm your address and payment, they will mail you the medicine. DO NOT USE THIS MEDICINE IF YOU ARE PREGNANT OR THINKING OF BECOMING PREGNANT.  [facility] Ph: [phone]   Must be dispensed in amber syringe to ensure quality of medication. PRIOR TO USING THIS MEDICATION, IT IS IMPORTANT TO INFORM YOUR PHYSICIAN IF YOU ARE PREGNANT OR THINKING OF BECOMING PREGNANT. IMPROPER USE OF THIS MEDICINE (USING IT  CAN CAUSE PERMANENT SCARRING.  **This medication was custom compounded for you based on the prescription orders of your physician.  How to use this medication: Apply medication at bedtime. Apply very small amount of medication to a flat plastic applicator. Use the applicator to apply a thin layer directly onto the raised seborrheic keratosis ("Wisdom Spot"). Use care to avoid applying the medicine to healthy skin. The medicine will break down healthy skin as well as the warts.  Cover the wart with the tape provided.  Put the cap back tightly on the syringe. Wash hands after applying the medicine. NEVER put the WartPEEL in the mouth, nose or eyes. Remove the tape in the morning and wash the area thoroughly.  In case of accidental ingestion or contact with the eye, nose or mouth, contact [place] or the local poison control. Do not use this medicine for more than 4 days to the affected area. You may only need to apply it for 1-2 nights if the spot is very thin.  If you develop any pain, stop using the medicine immediately and allow the area to heal, keeping it  moist with vaseline while healing.  Once the area is completely healed, if there is still some seborrheic keratosis left, you can repeat treatment, being careful not to use the medicine for too long (longer than 4 days or after developing any pain).  What to expect: During the first few days of application the seborrheic keratosis may swell and become white. This will slough off with continued applications. Normal Dosage: The medication is applied once daily for a time determined by your physician. Storage Requirements: Store this medication at room temperature. Keep out of reach of children. PROTECT FROM LIGHT. Expiration Date: The medication is good for four months from the date made. Do not keep outdated medication. Side effects: Rash and irritation, if medication is applied to healthy/normal skin. Risk of scarring if the medication is applied for too long to healthy skin. Cautions and Warnings: Only apply the medication to the seborrheic keratoses. Do not apply to good skin. Keep away from children. Do not use on nose, eyes or the mouth.   Inflamed seborrheic keratosis ankles x 40, back, arms, legs x 15 Reassured benign age-related growth.  Recommend observation.  Discussed cryotherapy if spot(s) become irritated or inflamed.  Prior to procedure, discussed risks of  blister formation, small wound, skin dyspigmentation, or rare scar following cryotherapy. Recommend Vaseline ointment to treated areas while healing.   Destruction of lesion - ankles x 40, back, arms, legs x 15 Complexity: simple   Destruction method: cryotherapy   Informed consent: discussed and consent obtained   Timeout:  patient name, date of birth, surgical site, and procedure verified Lesion destroyed using liquid nitrogen: Yes   Region frozen until ice ball extended beyond lesion: Yes   Outcome: patient tolerated procedure well with no complications   Post-procedure details: wound care instructions given     Dermatofibroma Right upper back paraspinal, left deltoid  - Firm pink/brown papulenodule with dimple sign - Benign appearing - Call for any changes   Lentigines - Scattered tan macules - Due to sun exposure - Benign-appearing, observe - Recommend daily broad spectrum sunscreen SPF 30+ to sun-exposed areas, reapply every 2 hours as needed. - Call for any changes  Seborrheic Keratoses - Stuck-on, waxy, tan-brown papules and/or plaques  - Benign-appearing - Discussed benign etiology and prognosis. - Observe - Call for any changes  Melanocytic Nevi - Tan-brown and/or pink-flesh-colored symmetric macules and papules - Benign appearing on exam today - Observation - Call clinic for new or changing moles - Recommend daily use of broad spectrum spf 30+ sunscreen to sun-exposed areas.   Hemangiomas - Red papules - Discussed benign nature - Observe - Call for any changes  Actinic Damage - Chronic condition, secondary to cumulative UV/sun exposure - diffuse scaly erythematous macules with underlying dyspigmentation - Recommend daily broad spectrum sunscreen SPF 30+ to sun-exposed areas, reapply every 2 hours as needed.  - Staying in the shade or wearing long sleeves, sun glasses (UVA+UVB protection) and wide brim hats (4-inch brim around the entire circumference of the hat) are also recommended for sun protection.  - Call for new or changing lesions.  History of Dysplastic Nevi Left lateral back at braline 2009 Right mid to low back paraspinal 2011 - No evidence of recurrence today - Recommend regular full body skin exams - Recommend daily broad spectrum sunscreen SPF 30+ to sun-exposed areas, reapply every 2 hours as needed.  - Call if any new or changing lesions are noted between office visits   Skin cancer screening performed today.   Return in about 1 year (around 11/26/2021) for TBSE, hx of dysplastic nevus .  IMarye Round, CMA, am acting as scribe for Sarina Ser, MD .  Documentation: I have reviewed the above documentation for accuracy and completeness, and I agree with the above.  Sarina Ser, MD

## 2020-11-27 ENCOUNTER — Encounter: Payer: Self-pay | Admitting: Dermatology

## 2020-12-05 ENCOUNTER — Other Ambulatory Visit: Payer: Self-pay | Admitting: Physician Assistant

## 2020-12-11 ENCOUNTER — Other Ambulatory Visit: Payer: Self-pay | Admitting: Physician Assistant

## 2021-01-13 ENCOUNTER — Telehealth: Payer: BC Managed Care – PPO | Admitting: Physician Assistant

## 2021-01-13 DIAGNOSIS — J019 Acute sinusitis, unspecified: Secondary | ICD-10-CM | POA: Diagnosis not present

## 2021-01-13 DIAGNOSIS — B9689 Other specified bacterial agents as the cause of diseases classified elsewhere: Secondary | ICD-10-CM | POA: Diagnosis not present

## 2021-01-14 MED ORDER — DOXYCYCLINE HYCLATE 100 MG PO TABS: 100.0000 mg | ORAL_TABLET | Freq: Two times a day (BID) | ORAL | 0 refills | Status: DC

## 2021-01-14 MED ORDER — BENZONATATE 100 MG PO CAPS: 100.0000 mg | ORAL_CAPSULE | Freq: Three times a day (TID) | ORAL | 0 refills | Status: DC | PRN

## 2021-01-14 NOTE — Progress Notes (Signed)
E-Visit for Sinus Problems  We are sorry that you are not feeling well.  Here is how we plan to help!  Based on what you have shared with me it looks like you have sinusitis.  Sinusitis is inflammation and infection in the sinus cavities of the head.  Based on your presentation I believe you most likely have Acute Bacterial Sinusitis.  This is an infection caused by bacteria and is treated with antibiotics. I have prescribed Doxycycline 100mg  by mouth twice a day for 10 days. Tessalon perles will be prescribed for cough. You may use an oral decongestant such as Mucinex D or if you have glaucoma or high blood pressure use plain Mucinex. Saline nasal spray help and can safely be used as often as needed for congestion.  If you develop worsening sinus pain, fever or notice severe headache and vision changes, or if symptoms are not better after completion of antibiotic, please schedule an appointment with a health care provider.    Sinus infections are not as easily transmitted as other respiratory infection, however we still recommend that you avoid close contact with loved ones, especially the very young and elderly.  Remember to wash your hands thoroughly throughout the day as this is the number one way to prevent the spread of infection!  Home Care: Only take medications as instructed by your medical team. Complete the entire course of an antibiotic. Do not take these medications with alcohol. A steam or ultrasonic humidifier can help congestion.  You can place a towel over your head and breathe in the steam from hot water coming from a faucet. Avoid close contacts especially the very young and the elderly. Cover your mouth when you cough or sneeze. Always remember to wash your hands.  Get Help Right Away If: You develop worsening fever or sinus pain. You develop a severe head ache or visual changes. Your symptoms persist after you have completed your treatment plan.  Make sure you Understand  these instructions. Will watch your condition. Will get help right away if you are not doing well or get worse.  Thank you for choosing an e-visit.  Your e-visit answers were reviewed by a board certified advanced clinical practitioner to complete your personal care plan. Depending upon the condition, your plan could have included both over the counter or prescription medications.  Please review your pharmacy choice. Make sure the pharmacy is open so you can pick up prescription now. If there is a problem, you may contact your provider through [employer] and have the prescription routed to another pharmacy.  Your safety is important to [place]. If you have drug allergies check your prescription carefully.   For the next 24 hours you can use MyChart to ask questions about today's visit, request a non-urgent call back, or ask for a work or school excuse. You will get an email in the next two days asking about your experience. I hope that your e-visit has been valuable and will speed your recovery.  I provided 5 minutes of non face-to-face time during this encounter for chart review and documentation.

## 2021-01-15 ENCOUNTER — Other Ambulatory Visit: Payer: Self-pay | Admitting: Physician Assistant

## 2021-01-16 ENCOUNTER — Other Ambulatory Visit: Payer: Self-pay | Admitting: Physician Assistant

## 2021-01-18 ENCOUNTER — Other Ambulatory Visit: Payer: Self-pay | Admitting: Physician Assistant

## 2021-01-19 ENCOUNTER — Other Ambulatory Visit: Payer: Self-pay | Admitting: Physician Assistant

## 2021-01-20 ENCOUNTER — Other Ambulatory Visit: Payer: Self-pay | Admitting: Physician Assistant

## 2021-01-21 ENCOUNTER — Other Ambulatory Visit: Payer: Self-pay | Admitting: Physician Assistant

## 2021-01-22 ENCOUNTER — Other Ambulatory Visit: Payer: Self-pay | Admitting: Physician Assistant

## 2021-01-28 ENCOUNTER — Encounter: Payer: Self-pay | Admitting: Gastroenterology

## 2021-02-22 ENCOUNTER — Ambulatory Visit
Admission: RE | Admit: 2021-02-22 | Discharge: 2021-02-22 | Disposition: A | Discharge status: Home / Self Care | Payer: BC Managed Care – PPO | Source: Ambulatory Visit | Attending: Otolaryngology | Admitting: Otolaryngology

## 2021-02-22 ENCOUNTER — Other Ambulatory Visit: Payer: Self-pay | Admitting: Otolaryngology

## 2021-02-22 DIAGNOSIS — R053 Chronic cough: Secondary | ICD-10-CM

## 2021-03-12 ENCOUNTER — Other Ambulatory Visit: Payer: Self-pay

## 2021-03-12 ENCOUNTER — Ambulatory Visit (AMBULATORY_SURGERY_CENTER): Payer: Self-pay

## 2021-03-12 VITALS — Ht 63.5 in | Wt 160.0 lb

## 2021-03-12 DIAGNOSIS — Z86010 Personal history of colonic polyps: Secondary | ICD-10-CM

## 2021-03-12 MED ORDER — PEG 3350-KCL-NA BICARB-NACL 420 G PO SOLR: 4000.0000 mL | Freq: Once | ORAL | 0 refills | Status: AC

## 2021-03-12 NOTE — Progress Notes (Signed)
Denies allergies to eggs or soy products. Denies complication of anesthesia or sedation. Denies use of weight loss medication. Denies use of O2.   Emmi instructions given for colonoscopy.  

## 2021-03-23 ENCOUNTER — Encounter: Payer: Self-pay | Admitting: Gastroenterology

## 2021-03-26 ENCOUNTER — Encounter: Payer: Self-pay | Admitting: Gastroenterology

## 2021-03-26 ENCOUNTER — Ambulatory Visit (AMBULATORY_SURGERY_CENTER): Payer: BC Managed Care – PPO | Admitting: Gastroenterology

## 2021-03-26 ENCOUNTER — Other Ambulatory Visit: Payer: Self-pay

## 2021-03-26 VITALS — BP 126/59 | HR 68 | Temp 97.8°F | Resp 16 | Ht 63.5 in | Wt 160.0 lb

## 2021-03-26 DIAGNOSIS — K621 Rectal polyp: Secondary | ICD-10-CM | POA: Diagnosis not present

## 2021-03-26 DIAGNOSIS — D129 Benign neoplasm of anus and anal canal: Secondary | ICD-10-CM

## 2021-03-26 DIAGNOSIS — Z86010 Personal history of colonic polyps: Secondary | ICD-10-CM

## 2021-03-26 DIAGNOSIS — D120 Benign neoplasm of cecum: Secondary | ICD-10-CM

## 2021-03-26 DIAGNOSIS — D128 Benign neoplasm of rectum: Secondary | ICD-10-CM

## 2021-03-26 MED ORDER — SODIUM CHLORIDE 0.9 % IV SOLN: 500.0000 mL | Freq: Once | INTRAVENOUS | Status: DC

## 2021-03-26 NOTE — Progress Notes (Signed)
Pt's states no medical or surgical changes since previsit or office visit. 

## 2021-03-26 NOTE — Patient Instructions (Signed)
Impression/Recommendations: ? ?Polyp and diverticulosis handouts given to patient. ? ?Resume previous diet. ?Continue present medications. ?Await pathology results. ? ?Repeat colonoscopy recommended for surveillance.  Date to be determined after pathology results reviewed. ? ?YOU HAD AN ENDOSCOPIC PROCEDURE TODAY AT [facility]:   Refer to the procedure report that was given to you for any specific questions about what was found during the examination.  If the procedure report does not answer your questions, please call your gastroenterologist to clarify.  If you requested that your care partner not be given the details of your procedure findings, then the procedure report has been included in a sealed envelope for you to review at your convenience later. ? ?YOU SHOULD EXPECT: Some feelings of bloating in the abdomen. Passage of more gas than usual.  Walking can help get rid of the air that was put into your GI tract during the procedure and reduce the bloating. If you had a lower endoscopy (such as a colonoscopy or flexible sigmoidoscopy) you may notice spotting of blood in your stool or on the toilet paper. If you underwent a bowel prep for your procedure, you may not have a normal bowel movement for a few days. ? ?Please Note:  You might notice some irritation and congestion in your nose or some drainage.  This is from the oxygen used during your procedure.  There is no need for concern and it should clear up in a day or so. ? ?SYMPTOMS TO REPORT IMMEDIATELY: ? ?Following lower endoscopy (colonoscopy or flexible sigmoidoscopy): ? Excessive amounts of blood in the stool ? Significant tenderness or worsening of abdominal pains ? Swelling of the abdomen that is new, acute ? Fever of 100?F or higher ? ?For urgent or emergent issues, a gastroenterologist can be reached at any hour by calling [phone]. ?Do not use MyChart messaging for urgent concerns.  ? ? ?DIET:  We do recommend a small meal at  first, but then you may proceed to your regular diet.  Drink plenty of fluids but you should avoid alcoholic beverages for 24 hours. ? ?ACTIVITY:  You should plan to take it easy for the rest of today and you should NOT DRIVE or use heavy machinery until tomorrow (because of the sedation medicines used during the test).   ? ?FOLLOW UP: ?Our staff will call the number listed on your records 48-72 hours following your procedure to check on you and address any questions or concerns that you may have regarding the information given to you following your procedure. If we do not reach you, we will leave a message.  We will attempt to reach you two times.  During this call, we will ask if you have developed any symptoms of COVID 19. If you develop any symptoms (ie: fever, flu-like symptoms, shortness of breath, cough etc.) before then, please call [phone].  If you test positive for Covid 19 in the 2 weeks post procedure, please call and report this information to [place].   ? ?If any biopsies were taken you will be contacted by phone or by letter within the next 1-3 weeks.  Please call us at [phone] if you have not heard about the biopsies in 3 weeks.  ? ? ?SIGNATURES/CONFIDENTIALITY: ?You and/or your care partner have signed paperwork which will be entered into your electronic medical record.  These signatures attest to the fact that that the information above on your After Visit Summary has been reviewed and is understood.  Full responsibility of the confidentiality of this discharge information lies with you and/or your care-partner.  ?

## 2021-03-26 NOTE — Progress Notes (Signed)
Called to room to assist during endoscopic procedure.  Patient ID and intended procedure confirmed with present staff. Received instructions for my participation in the procedure from the performing physician.  

## 2021-03-26 NOTE — Op Note (Signed)
[facility] ?Patient Name: [name] ?Procedure Date: 03/26/2021 8:48 AM ?MRN: [id number] ?Endoscopist: Estill Cotta. Loletha Carrow , MD ?Age: 66 ?Referring MD:  ?Date of Birth: 03-14-1955 ?Gender: Female ?Account #: [id number] ?Procedure:                Colonoscopy ?Indications:              Increased risk colon cancer surveillance: Personal  ?                          history of adenoma less than 10 mm in size ?                          TA in 2010, no polyp 2013 ?Medicines:                Monitored Anesthesia Care ?Procedure:                Pre-Anesthesia Assessment: ?                          - Prior to the procedure, a History and Physical  ?                          was performed, and patient medications and  ?                          allergies were reviewed. The patient's tolerance of  ?                          previous anesthesia was also reviewed. The risks  ?                          and benefits of the procedure and the sedation  ?                          options and risks were discussed with the patient.  ?                          All questions were answered, and informed consent  ?                          was obtained. Prior Anticoagulants: The patient has  ?                          taken no previous anticoagulant or antiplatelet  ?                          agents. ASA Grade Assessment: II - A patient with  ?                          mild systemic disease. After reviewing the risks  ?                          and benefits, the patient was deemed in  ?  satisfactory condition to undergo the procedure. ?                          After obtaining informed consent, the colonoscope  ?                          was passed under direct vision. Throughout the  ?                          procedure, the patient's blood pressure, pulse, and  ?                          oxygen saturations were monitored continuously. The  ?                          Olympus CF-HQ190L ([id number]) Colonoscope  was  ?                          introduced through the anus and advanced to the the  ?                          cecum, identified by appendiceal orifice and  ?                          ileocecal valve. The colonoscopy was performed  ?                          without difficulty. The patient tolerated the  ?                          procedure well. The quality of the bowel  ?                          preparation was excellent. The ileocecal valve,  ?                          appendiceal orifice, and rectum were photographed. ?Scope In: 8:58:04 AM ?Scope Out: 9:15:17 AM ?Scope Withdrawal Time: 0 hours 12 minutes 38 seconds  ?Total Procedure Duration: 0 hours 17 minutes 13 seconds  ?Findings:                 The perianal and digital rectal examinations were  ?                          normal. ?                          Repeat examination of right colon under NBI  ?                          performed. ?                          A diminutive polyp was found in the cecum. The  ?  polyp was semi-sessile. The polyp was removed with  ?                          a cold snare. Resection and retrieval were complete. ?                          A diminutive polyp was found in the distal rectum.  ?                          The polyp was sessile. The polyp was removed with a  ?                          cold biopsy forceps. Resection and retrieval were  ?                          complete. ?                          Multiple diverticula were found in the left colon. ?                          The exam was otherwise without abnormality on  ?                          direct and retroflexion views. ?Complications:            No immediate complications. ?Estimated Blood Loss:     Estimated blood loss was minimal. ?Impression:               - One diminutive polyp in the cecum, removed with a  ?                          cold snare. Resected and retrieved. ?                          - One diminutive polyp in the  distal rectum,  ?                          removed with a cold biopsy forceps. Resected and  ?                          retrieved. ?                          - Diverticulosis in the left colon. ?                          - The examination was otherwise normal on direct  ?                          and retroflexion views. ?Recommendation:           - Patient has a contact number available for  ?                          emergencies.  The signs and symptoms of potential  ?                          delayed complications were discussed with the  ?                          patient. Return to normal activities tomorrow.  ?                          Written discharge instructions were provided to the  ?                          patient. ?                          - Resume previous diet. ?                          - Continue present medications. ?                          - Await pathology results. ?                          - Repeat colonoscopy is recommended for  ?                          surveillance. The colonoscopy date will be  ?                          determined after pathology results from today's  ?                          exam become available for review. ?Orvan Papadakis L. Loletha Carrow, MD ?03/26/2021 9:19:44 AM ?This report has been signed electronically. ?

## 2021-03-26 NOTE — Progress Notes (Signed)
Report given to PACU, vss 

## 2021-03-26 NOTE — Progress Notes (Signed)
History and Physical: ? This patient presents for endoscopic testing for: ?Encounter Diagnosis  ?Name Primary?  ? Personal history of colonic polyps Yes  ? ? ?TA polyp 2010, no polyps 2013 ?Patient denies chronic abdominal pain, rectal bleeding, constipation or diarrhea. ? ? ?ROS: ?Patient denies chest pain or shortness of breath ? ? ?Past Medical History: ?Past Medical History:  ?Diagnosis Date  ? Allergy   ? AR (allergic rhinitis)   ? Dysplastic nevus 06/14/2007  ? Left lateral back braline, slight  ? Dysplastic nevus 03/18/2009  ? R mid to low back paraspinal, excision, mod-severe  ? Hypertension   ? IBS (irritable bowel syndrome)   ? Migraines   ? 1-3x/wk  ? Osteoarthritis   ? Osteopenia   ? ? ? ?Past Surgical History: ?Past Surgical History:  ?Procedure Laterality Date  ? BREAST CYST ASPIRATION Right 09/07/2017  ? BROW LIFT Bilateral 08/11/2015  ? Procedure: BLEPHAROPLASTY BILATERAL UPPER EYELIDS;  Surgeon: Karle Starch, MD;  Location: [place];  Service: Ophthalmology;  Laterality: Bilateral;  per cynde please leave patient first  ? CARPAL TUNNEL RELEASE  3/12  ? right. Left done 2010. Dr Fredna Dow and left side 2012  ? CHOLECYSTECTOMY  4/85  ? COLONOSCOPY    ? DILATION AND CURETTAGE OF UTERUS  10/05  ? tonsillectomy  12/76  ? TUBAL LIGATION  1990  ? VAGINAL DELIVERY    ? x 2  ? ? ?Allergies: ?Allergies  ?Allergen Reactions  ? Codeine   ? ? ?Outpatient Meds: ?Current Outpatient Medications  ?Medication Sig Dispense Refill  ? azelastine (ASTELIN) 0.1 % nasal spray Place into both nostrils.    ? estradiol (ESTRACE) 1 MG tablet Take 1 mg by mouth daily.    ? metoprolol tartrate (LOPRESSOR) 25 MG tablet TAKE 1 TABLET(25 MG) BY MOUTH DAILY 30 tablet 0  ? sertraline (ZOLOFT) 100 MG tablet Take 1 tablet (100 mg total) by mouth daily. 30 tablet 11  ? SUMAtriptan (IMITREX) 100 MG tablet TAKE 1 TABLET BY MOUTH ONCE, MAY REPEAT IN 2 HOURS IF HEADACHE PERSIST OR RECURS 12 tablet 11  ? meloxicam (MOBIC) 15 MG tablet  TAKE 1 TABLET(15 MG) BY MOUTH DAILY 30 tablet 3  ? progesterone (PROMETRIUM) 200 MG capsule progesterone micronized 200 mg capsule    ? ?Current Facility-Administered Medications  ?Medication Dose Route Frequency Provider Last Rate Last Admin  ? 0.9 %  sodium chloride infusion  500 mL Intravenous Once Doran Stabler, MD      ? ? ? ? ?___________________________________________________________________ ?Objective  ? ?Exam: ? ?BP 133/73   Pulse 66   Temp 97.8 ?F (36.6 ?C)   Resp 16   Ht 5' 3.5" (1.613 m)   Wt 160 lb (72.6 kg)   SpO2 99%   BMI 27.90 kg/m?  ? ?CV: RRR without murmur, S1/S2 ?Resp: clear to auscultation bilaterally, normal RR and effort noted ?GI: soft, no tenderness, with active bowel sounds. ? ? ?Assessment: ?Encounter Diagnosis  ?Name Primary?  ? Personal history of colonic polyps Yes  ? ? ? ?Plan: ?Colonoscopy ? The benefits and risks of the planned procedure were described in detail with the patient or (when appropriate) their health care proxy.  Risks were outlined as including, but not limited to, bleeding, infection, perforation, adverse medication reaction leading to cardiac or pulmonary decompensation, pancreatitis (if ERCP).  The limitation of incomplete mucosal visualization was also discussed.  No guarantees or warranties were given. ? ? ? ?The patient is  appropriate for an endoscopic procedure in the ambulatory setting. ? ? - Wilfrid Lund, MD ? ? ? ? ?

## 2021-03-30 ENCOUNTER — Telehealth: Payer: Self-pay | Admitting: *Deleted

## 2021-03-30 NOTE — Telephone Encounter (Signed)
?  Follow up Call- ? ?Call back number 03/26/2021  ?Post procedure Call Back phone  # [phone]  ?Permission to leave phone message Yes  ?Some recent data might be hidden  ?  ? ?Patient questions: ? ?Do you have a fever, pain , or abdominal swelling? No. ?Pain Score  0 * ? ?Have you tolerated food without any problems? Yes.   ? ?Have you been able to return to your normal activities? Yes.   ? ?Do you have any questions about your discharge instructions: ?Diet   No. ?Medications  No. ?Follow up visit  No. ? ?Do you have questions or concerns about your Care? No. ? ?Actions: ?* If pain score is 4 or above: ?No action needed, pain <4. ? ? ?

## 2021-03-31 ENCOUNTER — Encounter: Payer: Self-pay | Admitting: Gastroenterology

## 2021-04-01 ENCOUNTER — Ambulatory Visit: Payer: BC Managed Care – PPO | Admitting: Internal Medicine

## 2021-04-01 ENCOUNTER — Other Ambulatory Visit: Payer: Self-pay

## 2021-04-01 ENCOUNTER — Encounter: Payer: Self-pay | Admitting: Internal Medicine

## 2021-04-01 DIAGNOSIS — R053 Chronic cough: Secondary | ICD-10-CM

## 2021-04-01 DIAGNOSIS — R0609 Other forms of dyspnea: Secondary | ICD-10-CM | POA: Diagnosis not present

## 2021-04-01 NOTE — Patient Instructions (Addendum)
Only use your albuterol as a rescue medication to be used if you can't catch your breath by resting or doing a relaxed purse lip breathing pattern.  ?- The less you use it, the better it will work when you need it. ?- Ok to use up to 2 puffs  every 4 hours if you must but call for immediate appointment if use goes up over your usual need ?- Don't leave home without it !!  (think of it like the spare tire for your car)  ? ?Ok to try albuterol 15 min before an activity (on alternating days)  that you know would usually make you short of breath and see if it makes any difference and if makes none then don't take albuterol after activity unless you can't catch your breath as this means it's the resting that helps, not the albuterol. ? ?To get the most out of exercise, you need to be continuously aware that you are short of breath, but never out of breath, for at least 30 minutes daily. As you improve, it will actually be easier for you to do the same amount of exercise  in  30 minutes so always push to the level where you are short of breath.    ?Make sure you check your oxygen saturations at highest level of activity and call if drifting down ? ? ?If it turns out you really do need albuterol more than twice a week then start the flovent ? ?Pulmonary follow up is as needed  ?    ?

## 2021-04-01 NOTE — Progress Notes (Signed)
? ?[name], female    DOB: 10/23/1955   MRN: [id number] ? ? ?Brief patient profile:  ?49  yowf quit smoking 1989 = age 66 recurrent sinus problem fall > spring  referred to pulmonary clinic in [facility]  04/01/2021 by Dr Pryor Ochoa > Felicie Morn virtually for  cough x 2019  rx BREO gerd r and  better on prednisone x few days then worse again   ? ?PFTs reported nl 08/2018 though did get 0.21 liters or 11% better p saba  ? ?Covid Mar 08 2021  ? ? ?History of Present Illness  ?04/01/2021  Pulmonary/ 1st office eval/ [name] / [facility]  Office  ?Chief Complaint  ?Patient presents with  ? pulmonary consult  ?  Prod cough with clear to green sputum and sob with exertion x3y. Recent allergy test and will start allergy injections. Recent CXR.   ?Dyspnea:  walking at work x two flights of steps, ok on level ground nl pace  ?Cough: p stirs in am  x 30 min / variable and continues during day some worse when laugh / not worse p eating / min mucoid with constant sense of pnds during the day  ?Sleep: does fine falt bed a few pillows  ?SABA use: rarely  and not using correctly, has flovent hasn't started it yet  ?Allergy testing 04/01/2021 pos dust, dogs (has one in bedroom) trees, grass > shots planned  ? ?No obvious patterns in day to day or daytime variability or assoc excess/ purulent sputum or mucus plugs or hemoptysis or cp or chest tightness, subjective wheeze or overt  hb symptoms.  ? ?Sleeping  without nocturnal  or early am exacerbation  of respiratory  c/o's or need for noct saba. Also denies any obvious fluctuation of symptoms with weather or environmental changes or other aggravating or alleviating factors except as outlined above  ? ?No unusual exposure hx or h/o childhood pna/ asthma or knowledge of premature birth. ? ?Current Allergies, Complete Past Medical History, Past Surgical History, Family History, and Social History were reviewed in [employer] record. ? ?ROS  The following are not  active complaints unless bolded ?Hoarseness, sore throat, dysphagia, dental problems, itching, sneezing,  nasal congestion or discharge of excess mucus or purulent secretions, ear ache,   fever, chills, sweats, unintended wt loss or wt gain, classically pleuritic or exertional cp,  orthopnea pnd or arm/hand swelling  or leg swelling, presyncope, palpitations, abdominal pain, anorexia, nausea, vomiting, diarrhea  or change in bowel habits or change in bladder habits, change in stools or change in urine, dysuria, hematuria,  rash, arthralgias, visual complaints, headache, numbness, weakness or ataxia or problems with walking or coordination,  change in mood or  memory. ?      ?   ? ? ? ?Past Medical History:  ?Diagnosis Date  ? Allergy   ? AR (allergic rhinitis)   ? Dysplastic nevus 06/14/2007  ? Left lateral back braline, slight  ? Dysplastic nevus 03/18/2009  ? R mid to low back paraspinal, excision, mod-severe  ? Hypertension   ? IBS (irritable bowel syndrome)   ? Migraines   ? 1-3x/wk  ? Osteoarthritis   ? Osteopenia   ? ? ?Outpatient Medications Prior to Visit  ?Medication Sig Dispense Refill  ? azelastine (ASTELIN) 0.1 % nasal spray Place into both nostrils.    ? estradiol (ESTRACE) 1 MG tablet Take 1 mg by mouth daily.    ? meloxicam (MOBIC) 15 MG tablet TAKE  1 TABLET(15 MG) BY MOUTH DAILY 30 tablet 3  ? metoprolol tartrate (LOPRESSOR) 25 MG tablet TAKE 1 TABLET(25 MG) BY MOUTH DAILY 30 tablet 0  ? progesterone (PROMETRIUM) 200 MG capsule progesterone micronized 200 mg capsule    ? sertraline (ZOLOFT) 100 MG tablet Take 1 tablet (100 mg total) by mouth daily. 30 tablet 11  ? SUMAtriptan (IMITREX) 100 MG tablet TAKE 1 TABLET BY MOUTH ONCE, MAY REPEAT IN 2 HOURS IF HEADACHE PERSIST OR RECURS 12 tablet 11  ? ?  ? ? ?Objective:  ?  ? ?BP 134/76 (BP Location: Left Arm, Cuff Size: Normal)   Pulse 83   Temp 97.8 ?F (36.6 ?C) (Temporal)   Ht 5' 3.5" (1.613 m)   Wt 179 lb (81.2 kg)   SpO2 98%   BMI 31.21 kg/m?   ? ?SpO2: 98 % ? ?Amb mod obese wf with occ throat clearing, dry sounding cough  ? ? HEENT : pt wearing mask not removed for exam due to covid -19 concerns.  ? ? ?NECK :  without JVD/Nodes/TM/ nl carotid upstrokes bilaterally ? ? ?LUNGS: no acc muscle use,  Nl contour chest which is clear to A and P bilaterally without cough on insp or exp maneuvers ? ? ?CV:  RRR  no s3 or murmur or increase in P2, and no edema  ? ?ABD:  soft and nontender with nl inspiratory excursion in the supine position. No bruits or organomegaly appreciated, bowel sounds nl ? ?MS:  Nl gait/ ext warm without deformities, calf tenderness, cyanosis or clubbing ?No obvious joint restrictions  ? ?SKIN: warm and dry without lesions   ? ?NEURO:  alert, approp, nl sensorium with  no motor or cerebellar deficits apparent.  ? ? ?   ?I personally reviewed images and agree with radiology impression as follows:  ?CXR:   02/22/21  ?No active cardiopulmonary disease. ?Assessment  ? ?Chronic cough :  UACS vs cough variant asthma ?Onset 2019 in setting poorly controlled rhinitis ?PFTs reported nl 08/2018 though did get 0.21 liters or 11% better p saba  ?- 04/01/2021  After extensive coaching inhaler device,  effectiveness =  75% > try albuterol prn and if needing more than twice daily ok to start flovent rx   ? ?ddx is between asthma and Upper airway cough syndrome (previously labeled PNDS),  is so named because it's frequently impossible to sort out how much is  CR/sinusitis with freq throat clearing (which can be related to primary GERD)   vs  causing  secondary (" extra esophageal")  GERD from wide swings in gastric pressure that occur with throat clearing, often  promoting self use of mint and menthol lozenges that reduce the lower esophageal sphincter tone and exacerbate the problem further in a cyclical fashion.  ? ?These are the same pts (now being labeled as having "irritable larynx syndrome" by some cough centers) who not infrequently have a history of  having failed to tolerate ace inhibitors,  dry powder inhalers (like BREO)  or biphosphonates or report having atypical/extraesophageal reflux symptoms that don't respond to standard doses of PPI  and are easily confused as having aecopd or asthma flares by even experienced allergists/ pulmonologists (myself included).  ? ?In setting of poorly controlled rhinitis favor UACS, esp since no noct cough but need to use saba approp and start ICS if breaking rule of two's / reviewed.  ? ?If not better after starting allergy shots and taking flovent then need to return to  pulmonary clinic - otherwise f/u is prn  ? ? ?    ?  ?  ? ?DOE (dyspnea on exertion) ?Onset ? p covid  ?- 04/01/2021   Walked on RA   x  3   lap(s) =  approx 598f ft  @ fast pace, stopped due to end of study with lowest 02 sats 94% and min sob  ? ?cxr to me to does not suggest long covid or any form of progressive ILD but best way to monitor this is serial sats at peak ex and encouraged also use of albuterol pre ex on an alternate day basis to see if any benefit for doe or cough with f/u here prn  ? ?Each maintenance medication was reviewed in detail including emphasizing most importantly the difference between maintenance and prns and under what circumstances the prns are to be triggered using an action plan format where appropriate. ? ?Total time for H and P, chart review, counseling, use of SABA directly observing portions of ambulatory 02 saturation study/ and generating customized AVS unique to this office visit / same day charting = 40 min  ?     ?  ?      ? ? ? ? ?Christine Gully MD ?04/02/2021 ?    ?

## 2021-04-02 ENCOUNTER — Encounter: Payer: Self-pay | Admitting: Internal Medicine

## 2021-04-02 DIAGNOSIS — R0609 Other forms of dyspnea: Secondary | ICD-10-CM | POA: Insufficient documentation

## 2021-04-02 NOTE — Assessment & Plan Note (Addendum)
Onset 2019 in setting poorly controlled rhinitis ?PFTs reported nl 08/2018 though did get 0.21 liters or 11% better p saba  ?- 04/01/2021  After extensive coaching inhaler device,  effectiveness =  75% > try albuterol prn and if needing more than twice daily ok to start flovent rx   ? ?ddx is between asthma and Upper airway cough syndrome (previously labeled PNDS),  is so named because it's frequently impossible to sort out how much is  CR/sinusitis with freq throat clearing (which can be related to primary GERD)   vs  causing  secondary (" extra esophageal")  GERD from wide swings in gastric pressure that occur with throat clearing, often  promoting self use of mint and menthol lozenges that reduce the lower esophageal sphincter tone and exacerbate the problem further in a cyclical fashion.  ? ?These are the same pts (now being labeled as having "irritable larynx syndrome" by some cough centers) who not infrequently have a history of having failed to tolerate ace inhibitors,  dry powder inhalers (like BREO)  or biphosphonates or report having atypical/extraesophageal reflux symptoms that don't respond to standard doses of PPI  and are easily confused as having aecopd or asthma flares by even experienced allergists/ pulmonologists (myself included).  ? ?In setting of poorly controlled rhinitis favor UACS, esp since no noct cough but need to use saba approp and start ICS if breaking rule of two's / reviewed.  ? ?If not better after starting allergy shots and taking flovent then need to return to pulmonary clinic - otherwise f/u is prn  ? ? ?    ?  ?  ?

## 2021-04-02 NOTE — Assessment & Plan Note (Signed)
Onset ? p covid  ?- 04/01/2021   Walked on RA   x  3   lap(s) =  approx 58f ft  @ fast pace, stopped due to end of study with lowest 02 sats 94% and min sob  ? ?cxr to me to does not suggest long covid or any form of progressive ILD but best way to monitor this is serial sats at peak ex and encouraged also use of albuterol pre ex on an alternate day basis to see if any benefit for doe or cough with f/u here prn  ? ?Each maintenance medication was reviewed in detail including emphasizing most importantly the difference between maintenance and prns and under what circumstances the prns are to be triggered using an action plan format where appropriate. ? ?Total time for H and P, chart review, counseling, use of SABA directly observing portions of ambulatory 02 saturation study/ and generating customized AVS unique to this office visit / same day charting = 40 min  ?     ?  ?      ?

## 2021-04-28 ENCOUNTER — Other Ambulatory Visit: Payer: Self-pay | Admitting: *Deleted

## 2021-04-28 ENCOUNTER — Other Ambulatory Visit: Payer: Self-pay | Admitting: Physician Assistant

## 2021-04-28 MED ORDER — METOPROLOL TARTRATE 25 MG PO TABS: ORAL_TABLET | ORAL | 1 refills | Status: DC

## 2021-06-18 ENCOUNTER — Encounter: Payer: Self-pay | Admitting: Physician Assistant

## 2021-06-18 ENCOUNTER — Ambulatory Visit: Payer: BC Managed Care – PPO | Admitting: Physician Assistant

## 2021-06-18 VITALS — BP 138/84 | HR 71 | Ht 63.5 in | Wt 177.6 lb

## 2021-06-18 DIAGNOSIS — G43009 Migraine without aura, not intractable, without status migrainosus: Secondary | ICD-10-CM

## 2021-06-18 DIAGNOSIS — G43101 Migraine with aura, not intractable, with status migrainosus: Secondary | ICD-10-CM

## 2021-06-18 MED ORDER — METOPROLOL TARTRATE 25 MG PO TABS: 25.0000 mg | ORAL_TABLET | Freq: Every day | ORAL | 3 refills | Status: DC

## 2021-06-18 MED ORDER — SUMATRIPTAN SUCCINATE 100 MG PO TABS: ORAL_TABLET | ORAL | 11 refills | Status: DC

## 2021-06-18 MED ORDER — SERTRALINE HCL 100 MG PO TABS: 150.0000 mg | ORAL_TABLET | Freq: Every day | ORAL | 3 refills | Status: DC

## 2021-06-18 NOTE — Progress Notes (Signed)
History:  [name] is a 66 y.o. G2P0002 who presents to clinic today for headache folllow up.  Roselyn Meier was not effective for acute treatment.  She continues to prefer sumatriptan.  It works every time.  She notes no cardiovascular risk factors.  She checked her blood pressure over the last few days without metoprolol and noted it to be less than what it is today, even midday when she was feeling keyed up.  She is able to do deep breathing and relax herself.  She was feeling like the metoprolol in the morning was making her unable to exercise and feel worse in general.  This is why she discontinued.    Number of days in the last 4 weeks with:  Severe headache: 0 Moderate headache: 2 Mild headache: 8  No headache: 18   Past Medical History:  Diagnosis Date   Allergy    AR (allergic rhinitis)    Dysplastic nevus 06/14/2007   Left lateral back braline, slight   Dysplastic nevus 03/18/2009   R mid to low back paraspinal, excision, mod-severe   Hypertension    IBS (irritable bowel syndrome)    Migraines    1-3x/wk   Osteoarthritis    Osteopenia     Social History   Socioeconomic History   Marital status: Married    Spouse name: Not on file   Number of children: 2   Years of education: Not on file   Highest education level: Not on file  Occupational History   Occupation: Nurse, children's    Comment: [employer]   Tobacco Use   Smoking status: Former    Packs/day: 0.75    Years: 12.00    Pack years: 9.00    Types: Cigarettes    Quit date: 01/24/1988    Years since quitting: 33.4   Smokeless tobacco: Never  Vaping Use   Vaping Use: Never used  Substance and Sexual Activity   Alcohol use: Yes    Alcohol/week: 3.0 standard drinks    Types: 1 Cans of beer, 2 Standard drinks or equivalent per week    Comment: Rare   Drug use: No   Sexual activity: Yes    Partners: Male  Other Topics Concern   Not on file  Social History Narrative   Not on file   Social  Determinants of Health   Financial Resource Strain: Not on file  Food Insecurity: Not on file  Transportation Needs: Not on file  Physical Activity: Not on file  Stress: Not on file  Social Connections: Not on file  Intimate Partner Violence: Not on file    Family History  Problem Relation Age of Onset   Macular degeneration Mother    Hypertension Father    Diabetes Father    Hypertension Sister    Cerebral aneurysm Sister    Cancer Maternal Aunt        breast    Diabetes Paternal Grandfather    Cerebral aneurysm Brother    Colon cancer Neg Hx    Esophageal cancer Neg Hx    Stomach cancer Neg Hx    Rectal cancer Neg Hx     Allergies  Allergen Reactions   Codeine     Current Outpatient Medications on File Prior to Visit  Medication Sig Dispense Refill   albuterol (VENTOLIN HFA) 108 (90 Base) MCG/ACT inhaler Inhale into the lungs.     azelastine (ASTELIN) 0.1 % nasal spray Place into both nostrils.     estradiol (ESTRACE)  1 MG tablet Take 1 mg by mouth daily.     meloxicam (MOBIC) 15 MG tablet TAKE 1 TABLET(15 MG) BY MOUTH DAILY 30 tablet 3   metoprolol tartrate (LOPRESSOR) 25 MG tablet TAKE 1 TABLET(25 MG) BY MOUTH DAILY 90 tablet 1   progesterone (PROMETRIUM) 200 MG capsule progesterone micronized 200 mg capsule     sertraline (ZOLOFT) 100 MG tablet Take 1 tablet (100 mg total) by mouth daily. 30 tablet 11   SUMAtriptan (IMITREX) 100 MG tablet TAKE 1 TABLET BY MOUTH ONCE, MAY REPEAT IN 2 HOURS IF HEADACHE PERSIST OR RECURS 12 tablet 11   FLOVENT HFA 110 MCG/ACT inhaler Inhale into the lungs. (Patient not taking: Reported on 04/01/2021)     No current facility-administered medications on file prior to visit.     Review of Systems:  All pertinent positive/negative included in HPI, all other review of systems are negative   Objective:  Physical Exam BP 138/84   Pulse 71   Ht 5' 3.5" (1.613 m)   Wt 177 lb 9.6 oz (80.6 kg)   BMI 30.97 kg/m  CONSTITUTIONAL:  Well-developed, well-nourished female in no acute distress.  EYES: EOM intact ENT: Normocephalic CARDIOVASCULAR: Regular rate RESPIRATORY: Normal rate.  MUSCULOSKELETAL: Normal ROM SKIN: Warm, dry without erythema  NEUROLOGICAL: Alert, oriented, CN II-XII grossly intact, Appropriate balance PSYCH: Normal behavior, mood   Assessment & Plan:  Assessment: 1. Migraine with aura and with status migrainosus, not intractable   2. Migraine without aura and without status migrainosus, not intractable    Plan: Pt encouraged to restart metoprolol, but use in evening OR break in half and use 1/2 twice daily.   If no improvement in symptoms, call the office and we can switch to propanolol.   Encouraged to restart exercise as able. Follow-up in 12 months or sooner PRN  Total time spent face to face and in chart review/documentation: 56 minutes  Paticia Stack, PA-C 06/18/2021 8:29 AM

## 2021-07-13 ENCOUNTER — Ambulatory Visit: Payer: BC Managed Care – PPO | Admitting: Dermatology

## 2021-07-13 DIAGNOSIS — L821 Other seborrheic keratosis: Secondary | ICD-10-CM | POA: Diagnosis not present

## 2021-07-13 DIAGNOSIS — L820 Inflamed seborrheic keratosis: Secondary | ICD-10-CM

## 2021-07-13 MED ORDER — VALACYCLOVIR HCL 500 MG PO TABS: ORAL_TABLET | ORAL | 0 refills | Status: DC

## 2021-07-13 NOTE — Progress Notes (Unsigned)
   Follow-Up Visit   Subjective  [name] is a 66 y.o. female who presents for the following: Skin Problem (Patient here today for an itchy spot at right upper back. Spot has been itching constantly for about 3 weeks, she has not treated. ).  The following portions of the chart were reviewed this encounter and updated as appropriate:   Tobacco  Allergies  Meds  Problems  Med Hx  Surg Hx  Fam Hx      Review of Systems:  No other skin or systemic complaints except as noted in HPI or Assessment and Plan.  Objective  Well appearing patient in no apparent distress; mood and affect are within normal limits.  A focused examination was performed including face, chest, legs, arms. Relevant physical exam findings are noted in the Assessment and Plan.  right upper back Erythematous stuck-on, waxy papule or plaque    Assessment & Plan  Inflamed seborrheic keratosis right upper back  Symptomatic, irritating, patient would like treated.  Prior to procedure, discussed risks of blister formation, small wound, skin dyspigmentation, or rare scar following cryotherapy. Recommend Vaseline ointment to treated areas while healing.   Destruction of lesion - right upper back  Destruction method: cryotherapy   Informed consent: discussed and consent obtained   Lesion destroyed using liquid nitrogen: Yes   Cryotherapy cycles:  2 Outcome: patient tolerated procedure well with no complications   Post-procedure details: wound care instructions given    Seborrheic Keratoses - Stuck-on, waxy, tan-brown papules and/or plaques  - Benign-appearing - Discussed benign etiology and prognosis. - Observe - Call for any changes - Consider The Perfect Peel to treat, recommend treating in the fall or winter. She will plan for January. - Start valacyclovir 500 mg one day prior to peel and take twice daily for 10 days.  Return for TBSE, as scheduled.  Graciella Belton, RMA, am acting as scribe  for Forest Gleason, MD .  Documentation: I have reviewed the above documentation for accuracy and completeness, and I agree with the above.  Forest Gleason, MD

## 2021-07-13 NOTE — Patient Instructions (Addendum)
Cryotherapy Aftercare  Wash gently with soap and water everyday.   Apply Vaseline and Band-Aid daily until healed.    Recommend taking Heliocare sun protection supplement daily in sunny weather for additional sun protection. For maximum protection on the sunniest days, you can take up to 2 capsules of regular Heliocare OR take 1 capsule of Heliocare Ultra. For prolonged exposure (such as a full day in the sun), you can repeat your dose of the supplement 4 hours after your first dose. Heliocare can be purchased at [facility], at some [facility] or at www.heliocare.com.    Recommend daily broad spectrum sunscreen SPF 30+ to sun-exposed areas, reapply every 2 hours as needed. Call for new or changing lesions.  Staying in the shade or wearing long sleeves, sun glasses (UVA+UVB protection) and wide brim hats (4-inch brim around the entire circumference of the hat) are also recommended for sun protection.      Due to recent changes in healthcare laws, you may see results of your pathology and/or laboratory studies on MyChart before the doctors have had a chance to review them. We understand that in some cases there may be results that are confusing or concerning to you. Please understand that not all results are received at the same time and often the doctors may need to interpret multiple results in order to provide you with the best plan of care or course of treatment. Therefore, we ask that you please give us 2 business days to thoroughly review all your results before contacting the office for clarification. Should we see a critical lab result, you will be contacted sooner.   If You Need Anything After Your Visit  If you have any questions or concerns for your doctor, please call our main line at [phone] and press option 4 to reach your doctor's medical assistant. If no one answers, please leave a voicemail as directed and we will return your call as soon as possible. Messages left  after 4 pm will be answered the following business day.   You may also send us a message via MyChart. We typically respond to MyChart messages within 1-2 business days.  For prescription refills, please ask your pharmacy to contact our office. Our fax number is [phone].  If you have an urgent issue when the clinic is closed that cannot wait until the next business day, you can page your doctor at the number below.    Please note that while we do our best to be available for urgent issues outside of office hours, we are not available 24/7.   If you have an urgent issue and are unable to reach us, you may choose to seek medical care at your doctor's office, retail clinic, urgent care center, or emergency room.  If you have a medical emergency, please immediately call 911 or go to the emergency department.  Pager Numbers  - Dr. Kowalski: [phone]  - Dr. Moye: [phone]  - Dr. Stewart: [phone]  In the event of inclement weather, please call our main line at [phone] for an update on the status of any delays or closures.  Dermatology Medication Tips: Please keep the boxes that topical medications come in in order to help keep track of the instructions about where and how to use these. Pharmacies typically print the medication instructions only on the boxes and not directly on the medication tubes.   If your medication is too expensive, please contact our office at [phone] option 4 or send us   a message through MyChart.   We are unable to tell what your co-pay for medications will be in advance as this is different depending on your insurance coverage. However, we may be able to find a substitute medication at lower cost or fill out paperwork to get insurance to cover a needed medication.   If a prior authorization is required to get your medication covered by your insurance company, please allow us 1-2 business days to complete this process.  Drug prices often  vary depending on where the prescription is filled and some pharmacies may offer cheaper prices.  The website www.goodrx.com contains coupons for medications through different pharmacies. The prices here do not account for what the cost may be with help from insurance (it may be cheaper with your insurance), but the website can give you the price if you did not use any insurance.  - You can print the associated coupon and take it with your prescription to the pharmacy.  - You may also stop by our office during regular business hours and pick up a GoodRx coupon card.  - If you need your prescription sent electronically to a different pharmacy, notify our office through [place] MyChart or by phone at [phone] option 4.     Si Usted Necesita Algo Despus de Su Visita  Tambin puede enviarnos un mensaje a travs de MyChart. Por lo general respondemos a los mensajes de MyChart en el transcurso de 1 a 2 das hbiles.  Para renovar recetas, por favor pida a su farmacia que se ponga en contacto con nuestra oficina. Nuestro nmero de fax es el [phone].  Si tiene un asunto urgente cuando la clnica est cerrada y que no puede esperar hasta el siguiente da hbil, puede llamar/localizar a su doctor(a) al nmero que aparece a continuacin.   Por favor, tenga en cuenta que aunque hacemos todo lo posible para estar disponibles para asuntos urgentes fuera del horario de oficina, no estamos disponibles las 24 horas del da, los 7 das de la semana.   Si tiene un problema urgente y no puede comunicarse con nosotros, puede optar por buscar atencin mdica  en el consultorio de su doctor(a), en una clnica privada, en un centro de atencin urgente o en una sala de emergencias.  Si tiene una emergencia mdica, por favor llame inmediatamente al 911 o vaya a la sala de emergencias.  Nmeros de bper  - Dr. Kowalski: [phone]  - Dra. Moye: [phone]  - Dra. Stewart: [phone]  En caso  de inclemencias del tiempo, por favor llame a nuestra lnea principal al [phone] para una actualizacin sobre el estado de cualquier retraso o cierre.  Consejos para la medicacin en dermatologa: Por favor, guarde las cajas en las que vienen los medicamentos de uso tpico para ayudarle a seguir las instrucciones sobre dnde y cmo usarlos. Las farmacias generalmente imprimen las instrucciones del medicamento slo en las cajas y no directamente en los tubos del medicamento.   Si su medicamento es muy caro, por favor, pngase en contacto con nuestra oficina llamando al [phone] y presione la opcin 4 o envenos un mensaje a travs de MyChart.   No podemos decirle cul ser su copago por los medicamentos por adelantado ya que esto es diferente dependiendo de la cobertura de su seguro. Sin embargo, es posible que podamos encontrar un medicamento sustituto a menor costo o llenar un formulario para que el seguro cubra el medicamento que se considera necesario.   Si se requiere   una autorizacin previa para que su compaa de seguros cubra su medicamento, por favor permtanos de 1 a 2 das hbiles para completar este proceso.  Los precios de los medicamentos varan con frecuencia dependiendo del lugar de dnde se surte la receta y alguna farmacias pueden ofrecer precios ms baratos.  El sitio web www.goodrx.com tiene cupones para medicamentos de diferentes farmacias. Los precios aqu no tienen en cuenta lo que podra costar con la ayuda del seguro (puede ser ms barato con su seguro), pero el sitio web puede darle el precio si no utiliz ningn seguro.  - Puede imprimir el cupn correspondiente y llevarlo con su receta a la farmacia.  - Tambin puede pasar por nuestra oficina durante el horario de atencin regular y recoger una tarjeta de cupones de GoodRx.  - Si necesita que su receta se enve electrnicamente a una farmacia diferente, informe a nuestra oficina a travs de MyChart de [place] o  por telfono llamando al [phone] y presione la opcin 4.  

## 2021-07-14 ENCOUNTER — Encounter: Payer: Self-pay | Admitting: Dermatology

## 2021-10-27 ENCOUNTER — Other Ambulatory Visit: Payer: Self-pay | Admitting: Obstetrics and Gynecology

## 2021-10-27 DIAGNOSIS — E2839 Other primary ovarian failure: Secondary | ICD-10-CM

## 2021-11-22 ENCOUNTER — Encounter: Payer: Self-pay | Admitting: Internal Medicine

## 2021-12-09 ENCOUNTER — Ambulatory Visit: Payer: BC Managed Care – PPO | Admitting: Dermatology

## 2021-12-09 ENCOUNTER — Encounter: Payer: Self-pay | Admitting: Dermatology

## 2021-12-09 DIAGNOSIS — L820 Inflamed seborrheic keratosis: Secondary | ICD-10-CM

## 2021-12-09 DIAGNOSIS — Z1283 Encounter for screening for malignant neoplasm of skin: Secondary | ICD-10-CM | POA: Diagnosis not present

## 2021-12-09 DIAGNOSIS — Z86018 Personal history of other benign neoplasm: Secondary | ICD-10-CM

## 2021-12-09 DIAGNOSIS — D239 Other benign neoplasm of skin, unspecified: Secondary | ICD-10-CM

## 2021-12-09 DIAGNOSIS — L821 Other seborrheic keratosis: Secondary | ICD-10-CM | POA: Diagnosis not present

## 2021-12-09 DIAGNOSIS — L578 Other skin changes due to chronic exposure to nonionizing radiation: Secondary | ICD-10-CM

## 2021-12-09 DIAGNOSIS — D229 Melanocytic nevi, unspecified: Secondary | ICD-10-CM

## 2021-12-09 DIAGNOSIS — L814 Other melanin hyperpigmentation: Secondary | ICD-10-CM

## 2021-12-09 NOTE — Progress Notes (Signed)
Follow-Up Visit   Subjective  [name] is a 66 y.o. female who presents for the following: Annual Exam (HX dysplastic nevi, Aks, crusty lesions on the face taht she would like checked today). The patient presents for Total-Body Skin Exam (TBSE) for skin cancer screening and mole check.  The patient has spots, moles and lesions to be evaluated, some may be new or changing.   The following portions of the chart were reviewed this encounter and updated as appropriate:   Tobacco  Allergies  Meds  Problems  Med Hx  Surg Hx  Fam Hx      Review of Systems:  No other skin or systemic complaints except as noted in HPI or Assessment and Plan.  Objective  Well appearing patient in no apparent distress; mood and affect are within normal limits.  A full examination was performed including scalp, head, eyes, ears, nose, lips, neck, chest, axillae, abdomen, back, buttocks, bilateral upper extremities, bilateral lower extremities, hands, feet, fingers, toes, fingernails, and toenails. All findings within normal limits unless otherwise noted below.  eyelids x 3 (3) Erythematous stuck-on, waxy papule or plaque    Assessment & Plan  Inflamed seborrheic keratosis (3) eyelids x 3  Benign-appearing.  Call clinic for new or changing lesions.   Symptomatic per patient. Inflamed on exam.  Prior to procedure, discussed risks of blister formation, small wound, skin dyspigmentation, or rare scar following cryotherapy. Recommend Vaseline ointment to treated areas while healing.   Destruction of lesion - eyelids x 3 Complexity: simple   Destruction method: cryotherapy   Informed consent: discussed and consent obtained   Timeout:  patient name, date of birth, surgical site, and procedure verified Lesion destroyed using liquid nitrogen: Yes   Region frozen until ice ball extended beyond lesion: Yes   Outcome: patient tolerated procedure well with no complications   Post-procedure details:  wound care instructions given     Lentigines - Scattered tan macules - Due to sun exposure - Benign-appearing, observe - Recommend daily broad spectrum sunscreen SPF 30+ to sun-exposed areas, reapply every 2 hours as needed. - Call for any changes  Seborrheic Keratoses - Stuck-on, waxy, tan-brown papules and/or plaques  - Benign-appearing - Discussed benign etiology and prognosis. - Observe - Call for any changes  Melanocytic Nevi - Tan-brown and/or pink-flesh-colored symmetric macules and papules - Benign appearing on exam today - Observation - Call clinic for new or changing moles - Recommend daily use of broad spectrum spf 30+ sunscreen to sun-exposed areas.   Hemangiomas - Red papules - Discussed benign nature - Observe - Call for any changes  Actinic Damage - Chronic condition, secondary to cumulative UV/sun exposure - diffuse scaly erythematous macules with underlying dyspigmentation - Recommend daily broad spectrum sunscreen SPF 30+ to sun-exposed areas, reapply every 2 hours as needed.  - Staying in the shade or wearing long sleeves, sun glasses (UVA+UVB protection) and wide brim hats (4-inch brim around the entire circumference of the hat) are also recommended for sun protection.  - Call for new or changing lesions.  History of Dysplastic Nevi - No evidence of recurrence today - Recommend regular full body skin exams - Recommend daily broad spectrum sunscreen SPF 30+ to sun-exposed areas, reapply every 2 hours as needed.  - Call if any new or changing lesions are noted between office visits  Dermatofibroma - Firm pink/brown papulenodule with dimple sign - Benign appearing - Call for any changes  Skin cancer screening performed today.  Return in about 1 year (around 12/10/2022) for TBSE.  Luther Redo, CMA, am acting as scribe for Forest Gleason, MD .  Documentation: I have reviewed the above documentation for accuracy and completeness, and I agree with  the above.  Forest Gleason, MD

## 2021-12-09 NOTE — Patient Instructions (Signed)
Due to recent changes in healthcare laws, you may see results of your pathology and/or laboratory studies on MyChart before the doctors have had a chance to review them. We understand that in some cases there may be results that are confusing or concerning to you. Please understand that not all results are received at the same time and often the doctors may need to interpret multiple results in order to provide you with the best plan of care or course of treatment. Therefore, we ask that you please give us 2 business days to thoroughly review all your results before contacting the office for clarification. Should we see a critical lab result, you will be contacted sooner.   If You Need Anything After Your Visit  If you have any questions or concerns for your doctor, please call our main line at [phone] and press option 4 to reach your doctor's medical assistant. If no one answers, please leave a voicemail as directed and we will return your call as soon as possible. Messages left after 4 pm will be answered the following business day.   You may also send us a message via MyChart. We typically respond to MyChart messages within 1-2 business days.  For prescription refills, please ask your pharmacy to contact our office. Our fax number is [phone].  If you have an urgent issue when the clinic is closed that cannot wait until the next business day, you can page your doctor at the number below.    Please note that while we do our best to be available for urgent issues outside of office hours, we are not available 24/7.   If you have an urgent issue and are unable to reach us, you may choose to seek medical care at your doctor's office, retail clinic, urgent care center, or emergency room.  If you have a medical emergency, please immediately call 911 or go to the emergency department.  Pager Numbers  - Dr. Kowalski: [phone]  - Dr. Moye: [phone]  - Dr. Stewart:  [phone]  In the event of inclement weather, please call our main line at [phone] for an update on the status of any delays or closures.  Dermatology Medication Tips: Please keep the boxes that topical medications come in in order to help keep track of the instructions about where and how to use these. Pharmacies typically print the medication instructions only on the boxes and not directly on the medication tubes.   If your medication is too expensive, please contact our office at [phone] option 4 or send us a message through MyChart.   We are unable to tell what your co-pay for medications will be in advance as this is different depending on your insurance coverage. However, we may be able to find a substitute medication at lower cost or fill out paperwork to get insurance to cover a needed medication.   If a prior authorization is required to get your medication covered by your insurance company, please allow us 1-2 business days to complete this process.  Drug prices often vary depending on where the prescription is filled and some pharmacies may offer cheaper prices.  The website www.goodrx.com contains coupons for medications through different pharmacies. The prices here do not account for what the cost may be with help from insurance (it may be cheaper with your insurance), but the website can give you the price if you did not use any insurance.  - You can print the associated coupon and take it with   your prescription to the pharmacy.  - You may also stop by our office during regular business hours and pick up a GoodRx coupon card.  - If you need your prescription sent electronically to a different pharmacy, notify our office through [place] MyChart or by phone at [phone] option 4.     Si Usted Necesita Algo Despus de Su Visita  Tambin puede enviarnos un mensaje a travs de MyChart. Por lo general respondemos a los mensajes de MyChart en el transcurso de 1 a 2  das hbiles.  Para renovar recetas, por favor pida a su farmacia que se ponga en contacto con nuestra oficina. Nuestro nmero de fax es el [phone].  Si tiene un asunto urgente cuando la clnica est cerrada y que no puede esperar hasta el siguiente da hbil, puede llamar/localizar a su doctor(a) al nmero que aparece a continuacin.   Por favor, tenga en cuenta que aunque hacemos todo lo posible para estar disponibles para asuntos urgentes fuera del horario de oficina, no estamos disponibles las 24 horas del da, los 7 das de la semana.   Si tiene un problema urgente y no puede comunicarse con nosotros, puede optar por buscar atencin mdica  en el consultorio de su doctor(a), en una clnica privada, en un centro de atencin urgente o en una sala de emergencias.  Si tiene una emergencia mdica, por favor llame inmediatamente al 911 o vaya a la sala de emergencias.  Nmeros de bper  - Dr. Kowalski: [phone]  - Dra. Moye: [phone]  - Dra. Stewart: [phone]  En caso de inclemencias del tiempo, por favor llame a nuestra lnea principal al [phone] para una actualizacin sobre el estado de cualquier retraso o cierre.  Consejos para la medicacin en dermatologa: Por favor, guarde las cajas en las que vienen los medicamentos de uso tpico para ayudarle a seguir las instrucciones sobre dnde y cmo usarlos. Las farmacias generalmente imprimen las instrucciones del medicamento slo en las cajas y no directamente en los tubos del medicamento.   Si su medicamento es muy caro, por favor, pngase en contacto con nuestra oficina llamando al [phone] y presione la opcin 4 o envenos un mensaje a travs de MyChart.   No podemos decirle cul ser su copago por los medicamentos por adelantado ya que esto es diferente dependiendo de la cobertura de su seguro. Sin embargo, es posible que podamos encontrar un medicamento sustituto a menor costo o llenar un formulario para que el  seguro cubra el medicamento que se considera necesario.   Si se requiere una autorizacin previa para que su compaa de seguros cubra su medicamento, por favor permtanos de 1 a 2 das hbiles para completar este proceso.  Los precios de los medicamentos varan con frecuencia dependiendo del lugar de dnde se surte la receta y alguna farmacias pueden ofrecer precios ms baratos.  El sitio web www.goodrx.com tiene cupones para medicamentos de diferentes farmacias. Los precios aqu no tienen en cuenta lo que podra costar con la ayuda del seguro (puede ser ms barato con su seguro), pero el sitio web puede darle el precio si no utiliz ningn seguro.  - Puede imprimir el cupn correspondiente y llevarlo con su receta a la farmacia.  - Tambin puede pasar por nuestra oficina durante el horario de atencin regular y recoger una tarjeta de cupones de GoodRx.  - Si necesita que su receta se enve electrnicamente a una farmacia diferente, informe a nuestra oficina a travs de MyChart de [place]   o por telfono llamando al [phone] y presione la opcin 4.  

## 2021-12-19 ENCOUNTER — Encounter: Payer: Self-pay | Admitting: Dermatology

## 2021-12-20 ENCOUNTER — Other Ambulatory Visit: Payer: Self-pay | Admitting: *Deleted

## 2021-12-20 DIAGNOSIS — G43009 Migraine without aura, not intractable, without status migrainosus: Secondary | ICD-10-CM

## 2021-12-20 MED ORDER — SUMATRIPTAN SUCCINATE 100 MG PO TABS: ORAL_TABLET | ORAL | 11 refills | Status: DC

## 2022-01-01 ENCOUNTER — Encounter: Payer: Self-pay | Admitting: Internal Medicine

## 2022-01-19 ENCOUNTER — Other Ambulatory Visit: Payer: Self-pay | Admitting: *Deleted

## 2022-01-19 MED ORDER — METOPROLOL TARTRATE 25 MG PO TABS: 25.0000 mg | ORAL_TABLET | Freq: Every day | ORAL | 1 refills | Status: DC

## 2022-02-02 ENCOUNTER — Ambulatory Visit (INDEPENDENT_AMBULATORY_CARE_PROVIDER_SITE_OTHER): Payer: Self-pay | Admitting: Dermatology

## 2022-02-02 DIAGNOSIS — L821 Other seborrheic keratosis: Secondary | ICD-10-CM

## 2022-02-02 MED ORDER — HYDROCORTISONE 2.5 % EX CREA: TOPICAL_CREAM | CUTANEOUS | 0 refills | Status: DC

## 2022-02-02 NOTE — Patient Instructions (Signed)
Due to recent changes in healthcare laws, you may see results of your pathology and/or laboratory studies on MyChart before the doctors have had a chance to review them. We understand that in some cases there may be results that are confusing or concerning to you. Please understand that not all results are received at the same time and often the doctors may need to interpret multiple results in order to provide you with the best plan of care or course of treatment. Therefore, we ask that you please give us 2 business days to thoroughly review all your results before contacting the office for clarification. Should we see a critical lab result, you will be contacted sooner.   If You Need Anything After Your Visit  If you have any questions or concerns for your doctor, please call our main line at [phone] and press option 4 to reach your doctor's medical assistant. If no one answers, please leave a voicemail as directed and we will return your call as soon as possible. Messages left after 4 pm will be answered the following business day.   You may also send us a message via MyChart. We typically respond to MyChart messages within 1-2 business days.  For prescription refills, please ask your pharmacy to contact our office. Our fax number is [phone].  If you have an urgent issue when the clinic is closed that cannot wait until the next business day, you can page your doctor at the number below.    Please note that while we do our best to be available for urgent issues outside of office hours, we are not available 24/7.   If you have an urgent issue and are unable to reach us, you may choose to seek medical care at your doctor's office, retail clinic, urgent care center, or emergency room.  If you have a medical emergency, please immediately call 911 or go to the emergency department.  Pager Numbers  - Dr. Kowalski: [phone]  - Dr. Moye: [phone]  - Dr. Stewart:  [phone]  In the event of inclement weather, please call our main line at [phone] for an update on the status of any delays or closures.  Dermatology Medication Tips: Please keep the boxes that topical medications come in in order to help keep track of the instructions about where and how to use these. Pharmacies typically print the medication instructions only on the boxes and not directly on the medication tubes.   If your medication is too expensive, please contact our office at [phone] option 4 or send us a message through MyChart.   We are unable to tell what your co-pay for medications will be in advance as this is different depending on your insurance coverage. However, we may be able to find a substitute medication at lower cost or fill out paperwork to get insurance to cover a needed medication.   If a prior authorization is required to get your medication covered by your insurance company, please allow us 1-2 business days to complete this process.  Drug prices often vary depending on where the prescription is filled and some pharmacies may offer cheaper prices.  The website www.goodrx.com contains coupons for medications through different pharmacies. The prices here do not account for what the cost may be with help from insurance (it may be cheaper with your insurance), but the website can give you the price if you did not use any insurance.  - You can print the associated coupon and take it with   your prescription to the pharmacy.  - You may also stop by our office during regular business hours and pick up a GoodRx coupon card.  - If you need your prescription sent electronically to a different pharmacy, notify our office through [facility] MyChart or by phone at [phone] option 4.     Si Usted Necesita Algo Despus de Su Visita  Tambin puede enviarnos un mensaje a travs de MyChart. Por lo general respondemos a los mensajes de MyChart en el transcurso de 1 a 2  das hbiles.  Para renovar recetas, por favor pida a su farmacia que se ponga en contacto con nuestra oficina. Nuestro nmero de fax es el [phone].  Si tiene un asunto urgente cuando la clnica est cerrada y que no puede esperar hasta el siguiente da hbil, puede llamar/localizar a su doctor(a) al nmero que aparece a continuacin.   Por favor, tenga en cuenta que aunque hacemos todo lo posible para estar disponibles para asuntos urgentes fuera del horario de oficina, no estamos disponibles las 24 horas del da, los 7 das de la semana.   Si tiene un problema urgente y no puede comunicarse con nosotros, puede optar por buscar atencin mdica  en el consultorio de su doctor(a), en una clnica privada, en un centro de atencin urgente o en una sala de emergencias.  Si tiene una emergencia mdica, por favor llame inmediatamente al 911 o vaya a la sala de emergencias.  Nmeros de bper  - Dr. Kowalski: [phone]  - Dra. Moye: [phone]  - Dra. Stewart: [phone]  En caso de inclemencias del tiempo, por favor llame a nuestra lnea principal al [phone] para una actualizacin sobre el estado de cualquier retraso o cierre.  Consejos para la medicacin en dermatologa: Por favor, guarde las cajas en las que vienen los medicamentos de uso tpico para ayudarle a seguir las instrucciones sobre dnde y cmo usarlos. Las farmacias generalmente imprimen las instrucciones del medicamento slo en las cajas y no directamente en los tubos del medicamento.   Si su medicamento es muy caro, por favor, pngase en contacto con nuestra oficina llamando al [phone] y presione la opcin 4 o envenos un mensaje a travs de MyChart.   No podemos decirle cul ser su copago por los medicamentos por adelantado ya que esto es diferente dependiendo de la cobertura de su seguro. Sin embargo, es posible que podamos encontrar un medicamento sustituto a menor costo o llenar un formulario para que el  seguro cubra el medicamento que se considera necesario.   Si se requiere una autorizacin previa para que su compaa de seguros cubra su medicamento, por favor permtanos de 1 a 2 das hbiles para completar este proceso.  Los precios de los medicamentos varan con frecuencia dependiendo del lugar de dnde se surte la receta y alguna farmacias pueden ofrecer precios ms baratos.  El sitio web www.goodrx.com tiene cupones para medicamentos de diferentes farmacias. Los precios aqu no tienen en cuenta lo que podra costar con la ayuda del seguro (puede ser ms barato con su seguro), pero el sitio web puede darle el precio si no utiliz ningn seguro.  - Puede imprimir el cupn correspondiente y llevarlo con su receta a la farmacia.  - Tambin puede pasar por nuestra oficina durante el horario de atencin regular y recoger una tarjeta de cupones de GoodRx.  - Si necesita que su receta se enve electrnicamente a una farmacia diferente, informe a nuestra oficina a travs de MyChart de [facility]   o por telfono llamando al [phone] y presione la opcin 4.  

## 2022-02-02 NOTE — Progress Notes (Signed)
   Follow-Up Visit   Subjective  [name] is a 67 y.o. female who presents for the following: Procedure (Patient here today for Perfect Peel.).  The following portions of the chart were reviewed this encounter and updated as appropriate:   Tobacco  Allergies  Meds  Problems  Med Hx  Surg Hx  Fam Hx      Review of Systems:  No other skin or systemic complaints except as noted in HPI or Assessment and Plan.  Objective  Well appearing patient in no apparent distress; mood and affect are within normal limits.  A focused examination was performed including face. Relevant physical exam findings are noted in the Assessment and Plan.  face Stuck-on, waxy, tan-brown papules and plaques -- Discussed benign etiology and prognosis.              Assessment & Plan  Seborrheic keratosis face  Perfect Derma peel performed Lot # [id number] [id number] 11/24 HSV prophylaxis needed? Y, started yesterday. Continue valacyclovir 500 mg twice a day as directed Booster used? N Face prepped with acetone Perfect Derma Peel applied with gauze avoiding eyes, nares and mouth Post-Peel Care kit provided and instructions reviewed. Patient advised to leave peel on for 6 hours before washing face. Ok to leave on overnight if not irritated. Need for sun protection after peel reviewed.   Location: face  Chemical used: The Perfect Derma Peel  Informed consent: Discussed risks (infection, pain, swelling, peeling, blistering, allergic reaction, redness, textural changes of skin, crusting, ulceration, discoloration of skin, undesirable cosmetic result, need for additional treatment, herpes outbreak, and scarring) and benefits of the procedure, as well as the alternatives. Informed consent was obtained.  HSV Prophylaxis: Patient confirms they started valacyclovir prior to the procedure and will continue as directed.  Preparation: The area was cleansed prior to the application of the  chemical.  Procedure Details: Prior to applying the peel, the area was cleansed with acetone to remove oils. The chemical was applied using a standard technique. The patient was fanned for comfort.  Plan: The patient was advised to leave the peel on for 6 hours and then wash off with a gentle cleanser and follow with the post-peel moisturizer containing 1% hydrocortisone. She was instructed how to use the post-peel towelettes and sunscreen as described in the post-peel instruction pamphlet. Patient advised not to pull or pick at peeling skin. Patient will call for any problems including crusting, ulceration, fever, pus, or significant discomfort.  Return to clinic for additional treatment as needed.  Will send in [facility] 2.5% cream 1-2 times daily for up to 2 weeks as needed for irritation from peel   hydrocortisone 2.5 % cream - face Apply 1-2 times daily for up to 2 weeks as needed   Return for as scheduled, 1 month follow up.  Graciella Belton, RMA, am acting as scribe for Forest Gleason, MD .  Documentation: I have reviewed the above documentation for accuracy and completeness, and I agree with the above.  Forest Gleason, MD

## 2022-02-12 ENCOUNTER — Encounter: Payer: Self-pay | Admitting: Dermatology

## 2022-03-08 ENCOUNTER — Encounter: Payer: Self-pay | Admitting: Dermatology

## 2022-03-08 ENCOUNTER — Ambulatory Visit: Payer: BC Managed Care – PPO | Admitting: Dermatology

## 2022-03-08 VITALS — BP 134/84 | HR 72

## 2022-03-08 DIAGNOSIS — L821 Other seborrheic keratosis: Secondary | ICD-10-CM

## 2022-03-08 NOTE — Patient Instructions (Signed)
Due to recent changes in healthcare laws, you may see results of your pathology and/or laboratory studies on MyChart before the doctors have had a chance to review them. We understand that in some cases there may be results that are confusing or concerning to you. Please understand that not all results are received at the same time and often the doctors may need to interpret multiple results in order to provide you with the best plan of care or course of treatment. Therefore, we ask that you please give us 2 business days to thoroughly review all your results before contacting the office for clarification. Should we see a critical lab result, you will be contacted sooner.   If You Need Anything After Your Visit  If you have any questions or concerns for your doctor, please call our main line at [phone] and press option 4 to reach your doctor's medical assistant. If no one answers, please leave a voicemail as directed and we will return your call as soon as possible. Messages left after 4 pm will be answered the following business day.   You may also send us a message via MyChart. We typically respond to MyChart messages within 1-2 business days.  For prescription refills, please ask your pharmacy to contact our office. Our fax number is [phone].  If you have an urgent issue when the clinic is closed that cannot wait until the next business day, you can page your doctor at the number below.    Please note that while we do our best to be available for urgent issues outside of office hours, we are not available 24/7.   If you have an urgent issue and are unable to reach us, you may choose to seek medical care at your doctor's office, retail clinic, urgent care center, or emergency room.  If you have a medical emergency, please immediately call 911 or go to the emergency department.  Pager Numbers  - Dr. Kowalski: [phone]  - Dr. Moye: [phone]  - Dr. Stewart:  [phone]  In the event of inclement weather, please call our main line at [phone] for an update on the status of any delays or closures.  Dermatology Medication Tips: Please keep the boxes that topical medications come in in order to help keep track of the instructions about where and how to use these. Pharmacies typically print the medication instructions only on the boxes and not directly on the medication tubes.   If your medication is too expensive, please contact our office at [phone] option 4 or send us a message through MyChart.   We are unable to tell what your co-pay for medications will be in advance as this is different depending on your insurance coverage. However, we may be able to find a substitute medication at lower cost or fill out paperwork to get insurance to cover a needed medication.   If a prior authorization is required to get your medication covered by your insurance company, please allow us 1-2 business days to complete this process.  Drug prices often vary depending on where the prescription is filled and some pharmacies may offer cheaper prices.  The website www.goodrx.com contains coupons for medications through different pharmacies. The prices here do not account for what the cost may be with help from insurance (it may be cheaper with your insurance), but the website can give you the price if you did not use any insurance.  - You can print the associated coupon and take it with   your prescription to the pharmacy.  - You may also stop by our office during regular business hours and pick up a GoodRx coupon card.  - If you need your prescription sent electronically to a different pharmacy, notify our office through [facility] MyChart or by phone at [phone] option 4.     Si Usted Necesita Algo Despus de Su Visita  Tambin puede enviarnos un mensaje a travs de MyChart. Por lo general respondemos a los mensajes de MyChart en el transcurso de 1 a 2  das hbiles.  Para renovar recetas, por favor pida a su farmacia que se ponga en contacto con nuestra oficina. Nuestro nmero de fax es el [phone].  Si tiene un asunto urgente cuando la clnica est cerrada y que no puede esperar hasta el siguiente da hbil, puede llamar/localizar a su doctor(a) al nmero que aparece a continuacin.   Por favor, tenga en cuenta que aunque hacemos todo lo posible para estar disponibles para asuntos urgentes fuera del horario de oficina, no estamos disponibles las 24 horas del da, los 7 das de la semana.   Si tiene un problema urgente y no puede comunicarse con nosotros, puede optar por buscar atencin mdica  en el consultorio de su doctor(a), en una clnica privada, en un centro de atencin urgente o en una sala de emergencias.  Si tiene una emergencia mdica, por favor llame inmediatamente al 911 o vaya a la sala de emergencias.  Nmeros de bper  - Dr. Kowalski: [phone]  - Dra. Moye: [phone]  - Dra. Stewart: [phone]  En caso de inclemencias del tiempo, por favor llame a nuestra lnea principal al [phone] para una actualizacin sobre el estado de cualquier retraso o cierre.  Consejos para la medicacin en dermatologa: Por favor, guarde las cajas en las que vienen los medicamentos de uso tpico para ayudarle a seguir las instrucciones sobre dnde y cmo usarlos. Las farmacias generalmente imprimen las instrucciones del medicamento slo en las cajas y no directamente en los tubos del medicamento.   Si su medicamento es muy caro, por favor, pngase en contacto con nuestra oficina llamando al [phone] y presione la opcin 4 o envenos un mensaje a travs de MyChart.   No podemos decirle cul ser su copago por los medicamentos por adelantado ya que esto es diferente dependiendo de la cobertura de su seguro. Sin embargo, es posible que podamos encontrar un medicamento sustituto a menor costo o llenar un formulario para que el  seguro cubra el medicamento que se considera necesario.   Si se requiere una autorizacin previa para que su compaa de seguros cubra su medicamento, por favor permtanos de 1 a 2 das hbiles para completar este proceso.  Los precios de los medicamentos varan con frecuencia dependiendo del lugar de dnde se surte la receta y alguna farmacias pueden ofrecer precios ms baratos.  El sitio web www.goodrx.com tiene cupones para medicamentos de diferentes farmacias. Los precios aqu no tienen en cuenta lo que podra costar con la ayuda del seguro (puede ser ms barato con su seguro), pero el sitio web puede darle el precio si no utiliz ningn seguro.  - Puede imprimir el cupn correspondiente y llevarlo con su receta a la farmacia.  - Tambin puede pasar por nuestra oficina durante el horario de atencin regular y recoger una tarjeta de cupones de GoodRx.  - Si necesita que su receta se enve electrnicamente a una farmacia diferente, informe a nuestra oficina a travs de MyChart de [facility]   o por telfono llamando al [phone] y presione la opcin 4.  

## 2022-03-08 NOTE — Progress Notes (Signed)
   Follow-Up Visit   Subjective  AMIYRAH LAMERE is a 67 y.o. female who presents for the following: Follow-up (1 month f/u on her face, patient had a perfect peel chemical peel 02/02/22, not much improvement, patient report brown areas on her face are coming back. ). Cosmetic follow-up.   The following portions of the chart were reviewed this encounter and updated as appropriate:   Tobacco  Allergies  Meds  Problems  Med Hx  Surg Hx  Fam Hx     Review of Systems:  No other skin or systemic complaints except as noted in HPI or Assessment and Plan.  Objective  Well appearing patient in no apparent distress; mood and affect are within normal limits.  A focused examination was performed including face. Relevant physical exam findings are noted in the Assessment and Plan.  face Stuck-on, waxy, tan-brown macules -- Discussed benign etiology and prognosis.           Assessment & Plan  Seborrheic keratosis face  Seborrheic Keratoses- minimal improvement from Perfect peel 02/02/22   Benign-appearing Discussed benign etiology and prognosis. Discussed treatment option LN2 or repeat Perfect peel with booster  Discussed starting Valacyclovir before treatment   May consider TCA peel if no better after repeating Perfect peel  We will call patient to schedule her follow up visit.   Related Medications hydrocortisone 2.5 % cream Apply 1-2 times daily for up to 2 weeks as needed   Return if symptoms worsen or fail to improve.  I, Marye Round, CMA, am acting as scribe for Forest Gleason, MD .   Documentation: I have reviewed the above documentation for accuracy and completeness, and I agree with the above.  Forest Gleason, MD

## 2022-03-22 ENCOUNTER — Encounter: Payer: Self-pay | Admitting: Dermatology

## 2022-04-06 ENCOUNTER — Telehealth: Payer: Self-pay | Admitting: Dermatology

## 2022-04-06 NOTE — Telephone Encounter (Signed)
Please call patient to discuss:   I am discussing with the [facility] if we may have a bad batch since a couple of other people treated very recently also had minimal results. I could retreat her with the replacement patch of the Perfect Peel plus booster, or I could retreat you with a TCA peel which will definitely get her results in terms of improving dark spots, fine lines, and wrinkles but would make her more red and puffy while healing.  Either way I would do it no charge. Nancy Fetter protection would be extra important after the peels and I would have her start the valacyclovir before the peel again to protect against cold sores. The TCA peel is a bit more uncomfortable (burns during treatment but we use ice water wash cloths to cool things down) but is manageable and does get better results as it is a little deeper peel. We can also use topical numbing cream for 45 minutes before a TCA peel to make it more comfortable.   If she would like to do either, let me know and I can check the schedule for a time to do it. Thank you!

## 2022-04-11 NOTE — Telephone Encounter (Signed)
Called patient at providers request to discuss the below information. Patient is interested in TCA peel. She would like to ask about scheduling around spring break or scheduling sometime in December.   Routing to provider to inform and schedule.    Please call patient to discuss:   I am discussing with the [facility] if we may have a bad batch since a couple of other people treated very recently also had minimal results. I could retreat her with the replacement patch of the Perfect Peel plus booster, or I could retreat you with a TCA peel which will definitely get her results in terms of improving dark spots, fine lines, and wrinkles but would make her more red and puffy while healing.  Either way I would do it no charge. Nancy Fetter protection would be extra important after the peels and I would have her start the valacyclovir before the peel again to protect against cold sores. The TCA peel is a bit more uncomfortable (burns during treatment but we use ice water wash cloths to cool things down) but is manageable and does get better results as it is a little deeper peel. We can also use topical numbing cream for 45 minutes before a TCA peel to make it more comfortable.   If she would like to do either, let me know and I can check the schedule for a time to do it. Thank you!

## 2022-04-12 NOTE — Telephone Encounter (Signed)
Please inquire when spring break is and I can see if I have an opening. Thank you!

## 2022-04-13 NOTE — Telephone Encounter (Signed)
Called patient back in regards to spring break. She stated that she would rather wait until December to scheduled.   Routing to provider to advise and schedule.

## 2022-06-01 ENCOUNTER — Telehealth: Payer: Self-pay

## 2022-06-01 NOTE — Telephone Encounter (Signed)
Patient left nurse voicemail asking if she was to receive Tetnus and Shingle vaccine this Friday, can she keep and do the peel on May 23rd?

## 2022-06-01 NOTE — Telephone Encounter (Signed)
Yes, fine to have the shots before the peel. Thank you!

## 2022-06-01 NOTE — Telephone Encounter (Signed)
Dr. Neale Burly responded directly to the patient via MyChart advising it is fine to have vaccines prior to peel. Butch Penny., RMA

## 2022-06-06 ENCOUNTER — Other Ambulatory Visit: Payer: Self-pay

## 2022-06-06 MED ORDER — VALACYCLOVIR HCL 500 MG PO TABS: ORAL_TABLET | ORAL | 0 refills | Status: DC

## 2022-06-16 ENCOUNTER — Ambulatory Visit: Payer: Self-pay

## 2022-06-16 ENCOUNTER — Ambulatory Visit (INDEPENDENT_AMBULATORY_CARE_PROVIDER_SITE_OTHER): Payer: Self-pay | Admitting: Dermatology

## 2022-06-16 DIAGNOSIS — L988 Other specified disorders of the skin and subcutaneous tissue: Secondary | ICD-10-CM

## 2022-06-16 DIAGNOSIS — L814 Other melanin hyperpigmentation: Secondary | ICD-10-CM

## 2022-06-16 NOTE — Progress Notes (Signed)
   Follow-Up Visit   Subjective  [name] is a 67 y.o. female who presents for the following: TCA Peel  The following portions of the chart were reviewed this encounter and updated as appropriate: medications, allergies, medical history  Review of Systems:  No other skin or systemic complaints except as noted in HPI or Assessment and Plan.  Objective  Well appearing patient in no apparent distress; mood and affect are within normal limits.  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 Y, started yesterday.   Location: face, forehead, and cheeks  Chemical used: Jessner's solution was applied followed by TCA 35% to achieve a Ruben level two frost.   Informed consent: Discussed risks (infection, pain, swelling, peeling, blistering, allergic reaction, redness, textural changes of skin, crusting, ulceration, permanent discoloration of skin, undesirable cosmetic result, need for additional treatment, herpes outbreak, and scarring) and benefits of the procedure, as well as the alternatives. Informed consent was obtained.  HSV Prophylaxis: Patient confirms they started valacyclovir prior to the procedure and will continue as directed.  Anesthetic: Lidocaine/tetracaine 23%/7% lipoderm applied to entire face for 30 minutes prior to procedure.  Preparation: The area was cleansed and prepped with acetone prior to the application of the chemical.  Procedure Details: Ointment was applied to the corners of the eyes, nose, and mouth. The chemical was applied using a standard technique. It was left on until appropriate frost appeared. A lighter application of chemical was applied to neighboring areas in a "feathering" technique. The patient was fanned for comfort.Gustavus Bryant water wash cloths were applied between layers  for comfort.   Plan:  After the procedure petrolatum was applied to the treated area.  Hydrocortisone 2.5% ointment was sent to the pharmacy to apply twice a day to itchy areas if needed.   Aftercare Reviewed:  Wash gently with cetaphil, vanicream, cerave or other gentle cleanser twice daily Tylenol (acetaminophen) can be used for any pain.  Apply vaseline twice a day for 4 days then switch to vanicream moisturizer twice a day Start vinegar soaks daily. Use one tablespoon white vinegar in one pint of warm water  If itchy, use hydrocortisone 2.5% ointment twice a day for up to 5 days (itching can cause scratching=scar) Use daily sunscreen   Skin will look and feel tight. Expect about 4 days of swelling. Swelling peaks at 2-3 days then the skin begins to peel around day 3. Some oozing is normal. Keep covered with Vaseline to prevent any crusting then vanicream after 4 days. DO NOT pick/scratch (can cause scarring).   Continue Valtrex as prescribed.   Call or page with any problems including crusting, ulcer, fevers, pus, or pain.   Return to clinic for additional treatment as needed.   Return for appointment as scheduled for [facility].  I, Cari Caraway CMA, AMAA, scribed for Darden Dates, MD.  Documentation: I have reviewed the above documentation for accuracy and completeness, and I agree with the above.  Darden Dates, MD

## 2022-06-16 NOTE — Patient Instructions (Addendum)
Aftercare Instructions for after TCA Peel  Wash gently with cetaphil, vanicream, cerave or other gentle cleanser twice daily Tylenol (acetaminophen) can be used for any pain.  Apply vaseline twice a day for 4 days then switch to vanicream moisturizer twice a day Start vinegar soaks daily. Use one tablespoon white vinegar in one pint of warm water  If itchy, use hydrocortisone 2.5% ointment (prescription) or Hydrocortisone 1% cream (OTC) twice a day for up to 5 days (itching can cause scratching=scar) Use daily sunscreen   Skin will look and feel tight. Expect about 4 days of swelling. Swelling peaks at 2-3 days then the skin begins to peel around day 3. Some oozing is normal. Keep covered with Vaseline to prevent any crusting then vanicream after 4 days. DO NOT pick/scratch (can cause scarring).   Continue Valacyclovir (valtrex) if this was prescribed for you   Call or page with any problems including crusting, ulcer, fevers, pus, or pain.   Return to clinic for additional treatment as needed.  Due to recent changes in healthcare laws, you may see results of your pathology and/or laboratory studies on MyChart before the doctors have had a chance to review them. We understand that in some cases there may be results that are confusing or concerning to you. Please understand that not all results are received at the same time and often the doctors may need to interpret multiple results in order to provide you with the best plan of care or course of treatment. Therefore, we ask that you please give us 2 business days to thoroughly review all your results before contacting the office for clarification. Should we see a critical lab result, you will be contacted sooner.   If You Need Anything After Your Visit  If you have any questions or concerns for your doctor, please call our main line at [phone] and press option 4 to reach your doctor's medical assistant. If no one answers, please leave a  voicemail as directed and we will return your call as soon as possible. Messages left after 4 pm will be answered the following business day.   You may also send us a message via MyChart. We typically respond to MyChart messages within 1-2 business days.  For prescription refills, please ask your pharmacy to contact our office. Our fax number is [phone].  If you have an urgent issue when the clinic is closed that cannot wait until the next business day, you can page your doctor at the number below.    Please note that while we do our best to be available for urgent issues outside of office hours, we are not available 24/7.   If you have an urgent issue and are unable to reach us, you may choose to seek medical care at your doctor's office, retail clinic, urgent care center, or emergency room.  If you have a medical emergency, please immediately call 911 or go to the emergency department.  Pager Numbers  - Dr. Kowalski: [phone]  - Dr. Moye: [phone]  - Dr. Stewart: [phone]  In the event of inclement weather, please call our main line at [phone] for an update on the status of any delays or closures.  Dermatology Medication Tips: Please keep the boxes that topical medications come in in order to help keep track of the instructions about where and how to use these. Pharmacies typically print the medication instructions only on the boxes and not directly on the medication tubes.   If your medication   is too expensive, please contact our office at [phone] option 4 or send us a message through MyChart.   We are unable to tell what your co-pay for medications will be in advance as this is different depending on your insurance coverage. However, we may be able to find a substitute medication at lower cost or fill out paperwork to get insurance to cover a needed medication.   If a prior authorization is required to get your medication covered by your insurance  company, please allow us 1-2 business days to complete this process.  Drug prices often vary depending on where the prescription is filled and some pharmacies may offer cheaper prices.  The website www.goodrx.com contains coupons for medications through different pharmacies. The prices here do not account for what the cost may be with help from insurance (it may be cheaper with your insurance), but the website can give you the price if you did not use any insurance.  - You can print the associated coupon and take it with your prescription to the pharmacy.  - You may also stop by our office during regular business hours and pick up a GoodRx coupon card.  - If you need your prescription sent electronically to a different pharmacy, notify our office through [place] MyChart or by phone at [phone] option 4.     Si Usted Necesita Algo Despus de Su Visita  Tambin puede enviarnos un mensaje a travs de MyChart. Por lo general respondemos a los mensajes de MyChart en el transcurso de 1 a 2 das hbiles.  Para renovar recetas, por favor pida a su farmacia que se ponga en contacto con nuestra oficina. Nuestro nmero de fax es el [phone].  Si tiene un asunto urgente cuando la clnica est cerrada y que no puede esperar hasta el siguiente da hbil, puede llamar/localizar a su doctor(a) al nmero que aparece a continuacin.   Por favor, tenga en cuenta que aunque hacemos todo lo posible para estar disponibles para asuntos urgentes fuera del horario de oficina, no estamos disponibles las 24 horas del da, los 7 das de la semana.   Si tiene un problema urgente y no puede comunicarse con nosotros, puede optar por buscar atencin mdica  en el consultorio de su doctor(a), en una clnica privada, en un centro de atencin urgente o en una sala de emergencias.  Si tiene una emergencia mdica, por favor llame inmediatamente al 911 o vaya a la sala de emergencias.  Nmeros de bper  - Dr.  Kowalski: [phone]  - Dra. Moye: [phone]  - Dra. Stewart: [phone]  En caso de inclemencias del tiempo, por favor llame a nuestra lnea principal al [phone] para una actualizacin sobre el estado de cualquier retraso o cierre.  Consejos para la medicacin en dermatologa: Por favor, guarde las cajas en las que vienen los medicamentos de uso tpico para ayudarle a seguir las instrucciones sobre dnde y cmo usarlos. Las farmacias generalmente imprimen las instrucciones del medicamento slo en las cajas y no directamente en los tubos del medicamento.   Si su medicamento es muy caro, por favor, pngase en contacto con nuestra oficina llamando al [phone] y presione la opcin 4 o envenos un mensaje a travs de MyChart.   No podemos decirle cul ser su copago por los medicamentos por adelantado ya que esto es diferente dependiendo de la cobertura de su seguro. Sin embargo, es posible que podamos encontrar un medicamento sustituto a menor costo o llenar un formulario para que   el seguro cubra el medicamento que se considera necesario.   Si se requiere una autorizacin previa para que su compaa de seguros cubra su medicamento, por favor permtanos de 1 a 2 das hbiles para completar este proceso.  Los precios de los medicamentos varan con frecuencia dependiendo del lugar de dnde se surte la receta y alguna farmacias pueden ofrecer precios ms baratos.  El sitio web www.goodrx.com tiene cupones para medicamentos de diferentes farmacias. Los precios aqu no tienen en cuenta lo que podra costar con la ayuda del seguro (puede ser ms barato con su seguro), pero el sitio web puede darle el precio si no utiliz ningn seguro.  - Puede imprimir el cupn correspondiente y llevarlo con su receta a la farmacia.  - Tambin puede pasar por nuestra oficina durante el horario de atencin regular y recoger una tarjeta de cupones de GoodRx.  - Si necesita que su receta se enve  electrnicamente a una farmacia diferente, informe a nuestra oficina a travs de MyChart de [facility] o por telfono llamando al [phone] y presione la opcin 4.  

## 2022-06-21 ENCOUNTER — Other Ambulatory Visit: Payer: Self-pay | Admitting: *Deleted

## 2022-06-21 DIAGNOSIS — G43009 Migraine without aura, not intractable, without status migrainosus: Secondary | ICD-10-CM

## 2022-06-21 MED ORDER — SUMATRIPTAN SUCCINATE 100 MG PO TABS: ORAL_TABLET | ORAL | 0 refills | Status: DC

## 2022-06-23 ENCOUNTER — Ambulatory Visit: Payer: BC Managed Care – PPO

## 2022-06-23 ENCOUNTER — Ambulatory Visit: Payer: BC Managed Care – PPO | Admitting: Dermatology

## 2022-06-28 ENCOUNTER — Other Ambulatory Visit: Payer: Self-pay | Admitting: Physician Assistant

## 2022-06-28 DIAGNOSIS — G43009 Migraine without aura, not intractable, without status migrainosus: Secondary | ICD-10-CM

## 2022-07-11 ENCOUNTER — Other Ambulatory Visit: Payer: Self-pay | Admitting: Obstetrics and Gynecology

## 2022-07-11 DIAGNOSIS — Z0000 Encounter for general adult medical examination without abnormal findings: Secondary | ICD-10-CM

## 2022-07-22 ENCOUNTER — Ambulatory Visit: Payer: BC Managed Care – PPO | Admitting: Physician Assistant

## 2022-07-22 ENCOUNTER — Encounter: Payer: Self-pay | Admitting: Physician Assistant

## 2022-07-22 DIAGNOSIS — G43009 Migraine without aura, not intractable, without status migrainosus: Secondary | ICD-10-CM | POA: Diagnosis not present

## 2022-07-22 MED ORDER — PROPRANOLOL HCL 20 MG PO TABS: 20.0000 mg | ORAL_TABLET | Freq: Two times a day (BID) | ORAL | 11 refills | Status: DC

## 2022-07-22 MED ORDER — SERTRALINE HCL 100 MG PO TABS
100.0000 mg | ORAL_TABLET | Freq: Every day | ORAL | 3 refills | Status: DC
Start: 2022-07-22 — End: 2023-10-13

## 2022-07-22 MED ORDER — SUMATRIPTAN SUCCINATE 100 MG PO TABS
ORAL_TABLET | ORAL | 11 refills | Status: DC
Start: 2022-07-22 — End: 2023-08-10

## 2022-07-22 NOTE — Patient Instructions (Signed)
Migraine Headache A migraine headache is a very strong throbbing pain on one or both sides of your head. This type of headache can also cause other symptoms. It can last from 4 hours to 3 days. Talk with your doctor about what things may bring on (trigger) this condition. What are the causes? The exact cause of a migraine is not known. This condition may be brought on or caused by: Smoking. Medicines, such as: Medicine used to treat chest pain (nitroglycerin). Birth control pills. Estrogen. Some blood pressure medicines. Certain substances in some foods or drinks. Foods and drinks, such as: Cheese. Chocolate. Alcohol. Caffeine. Doing physical activity that is very hard. Other things that may trigger a migraine headache include: Periods. Pregnancy. Hunger. Stress. Getting too much or too little sleep. Weather changes. Feeling tired (fatigue). What increases the risk? Being 25-55 years old. Being female. Having a family history of migraine headaches. Being Caucasian. Having a mental health condition, such as being sad (depressed) or feeling worried or nervous (anxious). Being very overweight (obese). What are the signs or symptoms? A throbbing pain. This pain may: Happen in any area of the head, such as on one or both sides. Make it hard to do daily activities. Get worse with physical activity. Get worse around bright lights, loud noises, or smells. Other symptoms may include: Feeling like you may vomit (nauseous). Vomiting. Dizziness. Before a migraine headache starts, you may get warning signs (an aura). An aura may include: Seeing flashing lights or having blind spots. Seeing bright spots, halos, or zigzag lines. Having tunnel vision or blurred vision. Having numbness or a tingling feeling. Having trouble talking. Having weak muscles. After a migraine ends, you may have symptoms. These may include: Tiredness. Trouble thinking (concentrating). How is this  treated? Taking medicines that: Relieve pain. Relieve the feeling like you may vomit. Prevent migraine headaches. Treatment may also include: Acupuncture. Lifestyle changes like avoiding foods that bring on migraine headaches. Learning ways to control your body functions (biofeedback). Therapy to help you know and deal with negative thoughts (cognitive behavioral therapy). Follow these instructions at home: Medicines Take over-the-counter and prescription medicines only as told by your doctor. If told, take steps to prevent problems with pooping (constipation). You may need to: Drink enough fluid to keep your pee (urine) pale yellow. Take medicines. You will be told what medicines to take. Eat foods that are high in fiber. These include beans, whole grains, and fresh fruits and vegetables. Limit foods that are high in fat and sugar. These include fried or sweet foods. Ask your doctor if you should avoid driving or using machines while you are taking your medicine. Lifestyle  Do not drink alcohol. Do not smoke or use any products that contain nicotine or tobacco. If you need help quitting, ask your doctor. Get 7-9 hours of sleep each night, or the amount recommended by your doctor. Find ways to deal with stress, such as meditation, deep breathing, or yoga. Try to exercise often. This can help lessen how bad and how often your migraines happen. General instructions Keep a journal to find out what may bring on your migraine headaches. This can help you avoid those things. For example, write down: What you eat and drink. How much sleep you get. Any change to your medicines or diet. If you have a migraine headache: Avoid things that make your symptoms worse, such as bright lights. Lie down in a dark, quiet room. Do not drive or use machinery. Ask your   doctor what activities are safe for you. Where to find more information Coalition for Headache and Migraine Patients (CHAMP):  headachemigraine.org American Migraine Foundation: americanmigrainefoundation.org National Headache Foundation: headaches.org Contact a doctor if: You get a migraine headache that is different or worse than others you have had. You have more than 15 days of headaches in one month. Get help right away if: Your migraine headache gets very bad. Your migraine headache lasts more than 72 hours. You have a fever or stiff neck. You have trouble seeing. Your muscles feel weak or like you cannot control them. You lose your balance a lot. You have trouble walking. You faint. You have a seizure. This information is not intended to replace advice given to you by your health care provider. Make sure you discuss any questions you have with your health care provider. Document Revised: 09/06/2021 Document Reviewed: 09/06/2021 Elsevier Patient Education  2024 Elsevier Inc.  

## 2022-07-22 NOTE — Progress Notes (Signed)
Patient here to F/U on Headache Management.   Reports having having a HA today.   History:  [name] is a 67 y.o. G2P0002 who presents to clinic today for annual followup.  Her headache quality remains unchanged but she has increased frequency.  She notes no particular reason.  She plans to retire from [employer] in December later this year.  She tried metoprolol but found that she just didn't feel well while using it.  She discontinued.  She has not had blood pressure issues.  She continues to have no cardiac concerns.  She notes a stressful event happening just at the beginning of her visit today.   She has been using 100mg  of zoloft max.  She notes sometimes using 1/2 tab or none.  This has not led to increased symptoms.  Number of days in the last 4 weeks with:  Severe headache: 2 Moderate headache: 5 Mild headache: 8  No headache: 13   Past Medical History:  Diagnosis Date   Allergy    AR (allergic rhinitis)    Dysplastic nevus 06/14/2007   Left lateral back braline, slight   Dysplastic nevus 03/18/2009   R mid to low back paraspinal, excision, mod-severe   Hypertension    IBS (irritable bowel syndrome)    Migraines    1-3x/wk   Osteoarthritis    Osteopenia     Social History   Socioeconomic History   Marital status: Married    Spouse name: Not on file   Number of children: 2   Years of education: Not on file   Highest education level: Not on file  Occupational History   Occupation: Nurse, adult    Comment: [employer]   Tobacco Use   Smoking status: Former    Packs/day: 0.75    Years: 12.00    Additional pack years: 0.00    Total pack years: 9.00    Types: Cigarettes    Quit date: 01/24/1988    Years since quitting: 34.5   Smokeless tobacco: Never  Vaping Use   Vaping Use: Never used  Substance and Sexual Activity   Alcohol use: Yes    Alcohol/week: 3.0 standard drinks of alcohol    Types: 1 Cans of beer, 2 Standard drinks or equivalent per  week    Comment: Rare   Drug use: No   Sexual activity: Yes    Partners: Male  Other Topics Concern   Not on file  Social History Narrative   Not on file   Social Determinants of Health   Financial Resource Strain: Not on file  Food Insecurity: Not on file  Transportation Needs: Not on file  Physical Activity: Not on file  Stress: Not on file  Social Connections: Not on file  Intimate Partner Violence: Not on file    Family History  Problem Relation Age of Onset   Macular degeneration Mother    Hypertension Father    Diabetes Father    Hypertension Sister    Cerebral aneurysm Sister    Cancer Maternal Aunt        breast    Diabetes Paternal Grandfather    Cerebral aneurysm Brother    Colon cancer Neg Hx    Esophageal cancer Neg Hx    Stomach cancer Neg Hx    Rectal cancer Neg Hx     Allergies  Allergen Reactions   Codeine     Current Outpatient Medications on File Prior to Visit  Medication Sig Dispense Refill  albuterol (VENTOLIN HFA) 108 (90 Base) MCG/ACT inhaler Inhale into the lungs.     azelastine (ASTELIN) 0.1 % nasal spray Place into both nostrils.     estradiol (ESTRACE) 1 MG tablet Take 1 mg by mouth daily.     FLOVENT HFA 110 MCG/ACT inhaler Inhale into the lungs. (Patient not taking: Reported on 04/01/2021)     hydrocortisone 2.5 % cream Apply 1-2 times daily for up to 2 weeks as needed 30 g 0   meloxicam (MOBIC) 15 MG tablet TAKE 1 TABLET(15 MG) BY MOUTH DAILY 30 tablet 3   metoprolol tartrate (LOPRESSOR) 25 MG tablet Take 1 tablet (25 mg total) by mouth daily. 90 tablet 1   progesterone (PROMETRIUM) 200 MG capsule progesterone micronized 200 mg capsule     sertraline (ZOLOFT) 100 MG tablet TAKE 1 AND 1/2 TABLETS(150 MG) BY MOUTH DAILY 135 tablet 0   SUMAtriptan (IMITREX) 100 MG tablet TAKE 1 TABLET BY MOUTH ONCE, MAY REPEAT IN 2 HOURS IF HEADACHE PERSIST OR RECURS 12 tablet 0   valACYclovir (VALTREX) 500 MG tablet One day prior to procedure, start  twice daily x 10 days. 20 tablet 0   No current facility-administered medications on file prior to visit.     Review of Systems:  All pertinent positive/negative included in HPI, all other review of systems are negative   Objective:  Physical Exam There were no vitals taken for this visit. CONSTITUTIONAL: Well-developed, well-nourished female in no acute distress.  EYES: EOM intact ENT: Normocephalic CARDIOVASCULAR: Regular rate  RESPIRATORY: Normal rate.  MUSCULOSKELETAL: Normal ROM SKIN: Warm, dry without erythema  NEUROLOGICAL: Alert, oriented, CN II-XII grossly intact, Appropriate balance PSYCH: Normal behavior, mood   Assessment & Plan:  Assessment: 1. Migraine without aura and without status migrainosus, not intractable      Plan: D/c metoprolol Begin propanolol up to 20mg  twice daily as tolerated Continue sumatriptan - care not to use if elevated BP.  Continue to monitor bp. Zoloft decrease to 100mg  (consistent with current actual use)  Follow-up in 12 months or sooner PRN- if the propanolol does not prevent migraine  52 minutes of face to face time spent with patient this encounter  Bertram Denver, PA-C 07/22/2022 10:03 AM

## 2022-11-05 IMAGING — DX DG CHEST 2V
2 series · 2 of 2 positions shown · non-contrast
Comparison: 06/13/2018

CLINICAL DATA: 65-year-old female with chronic cough

EXAM:
CHEST - 2 VIEW

[w chest pa]
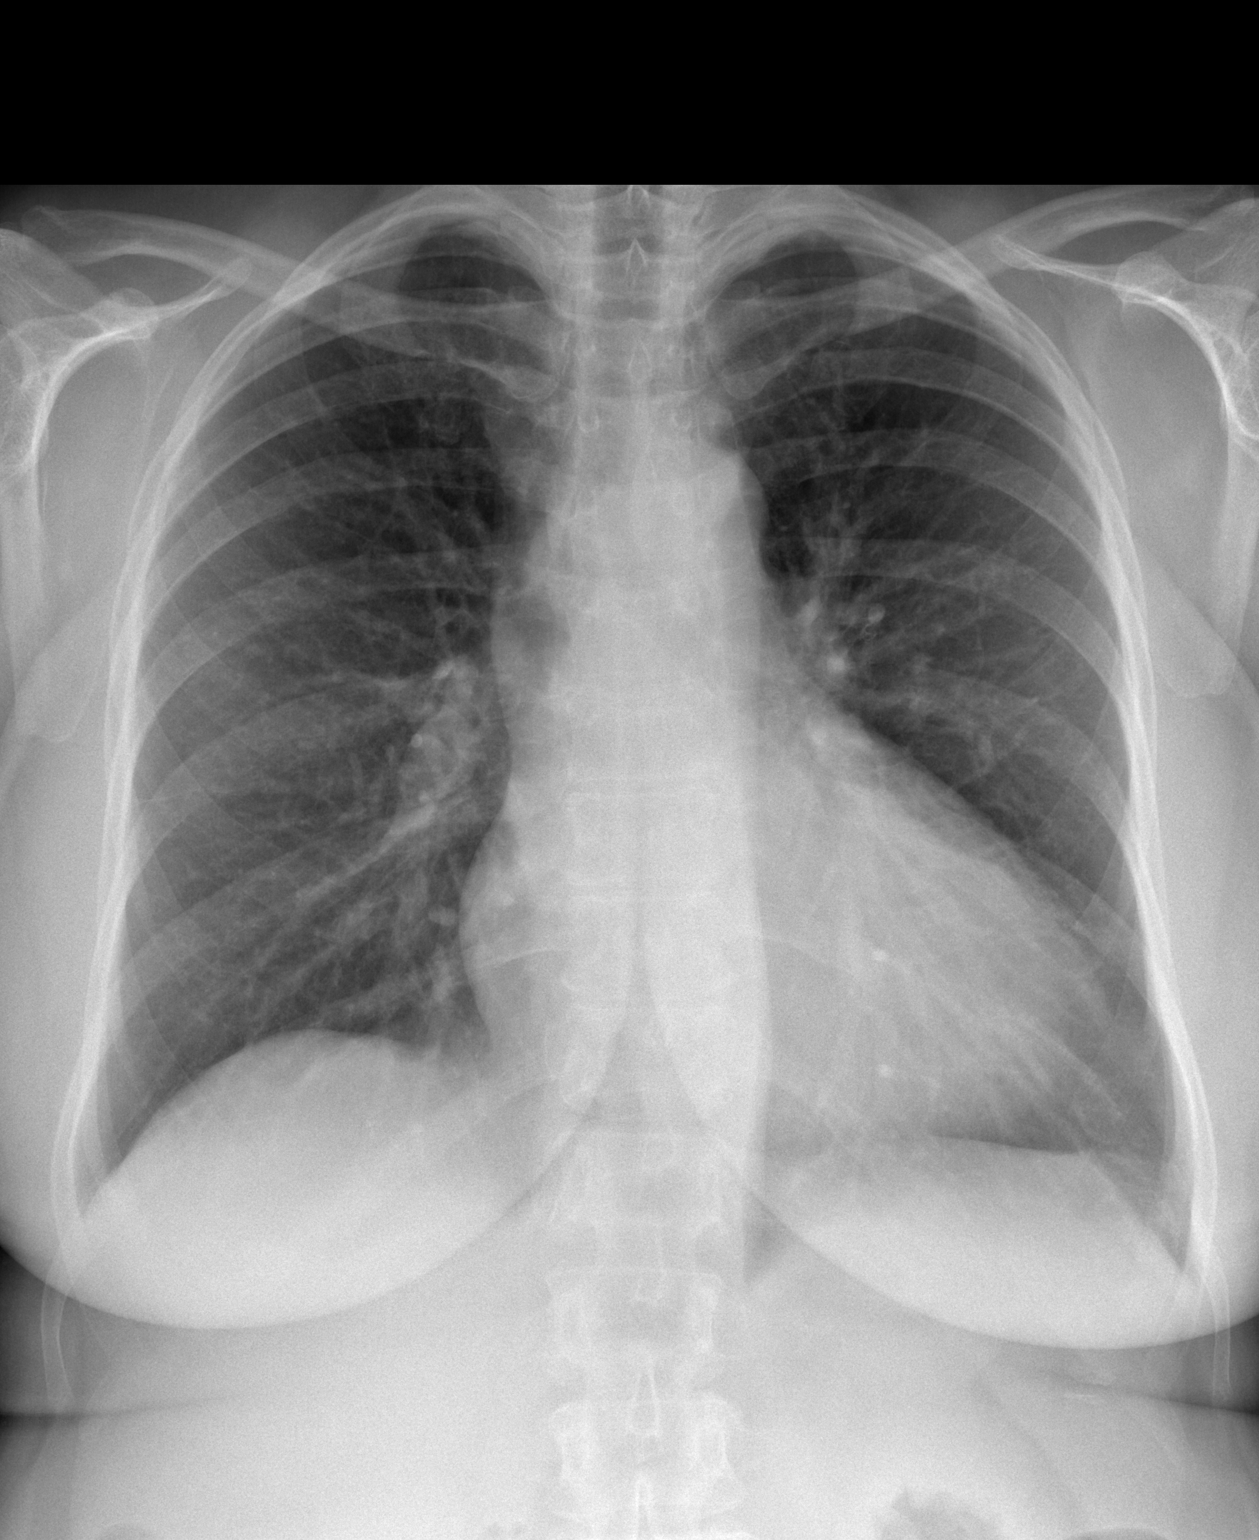

[w chest lat]
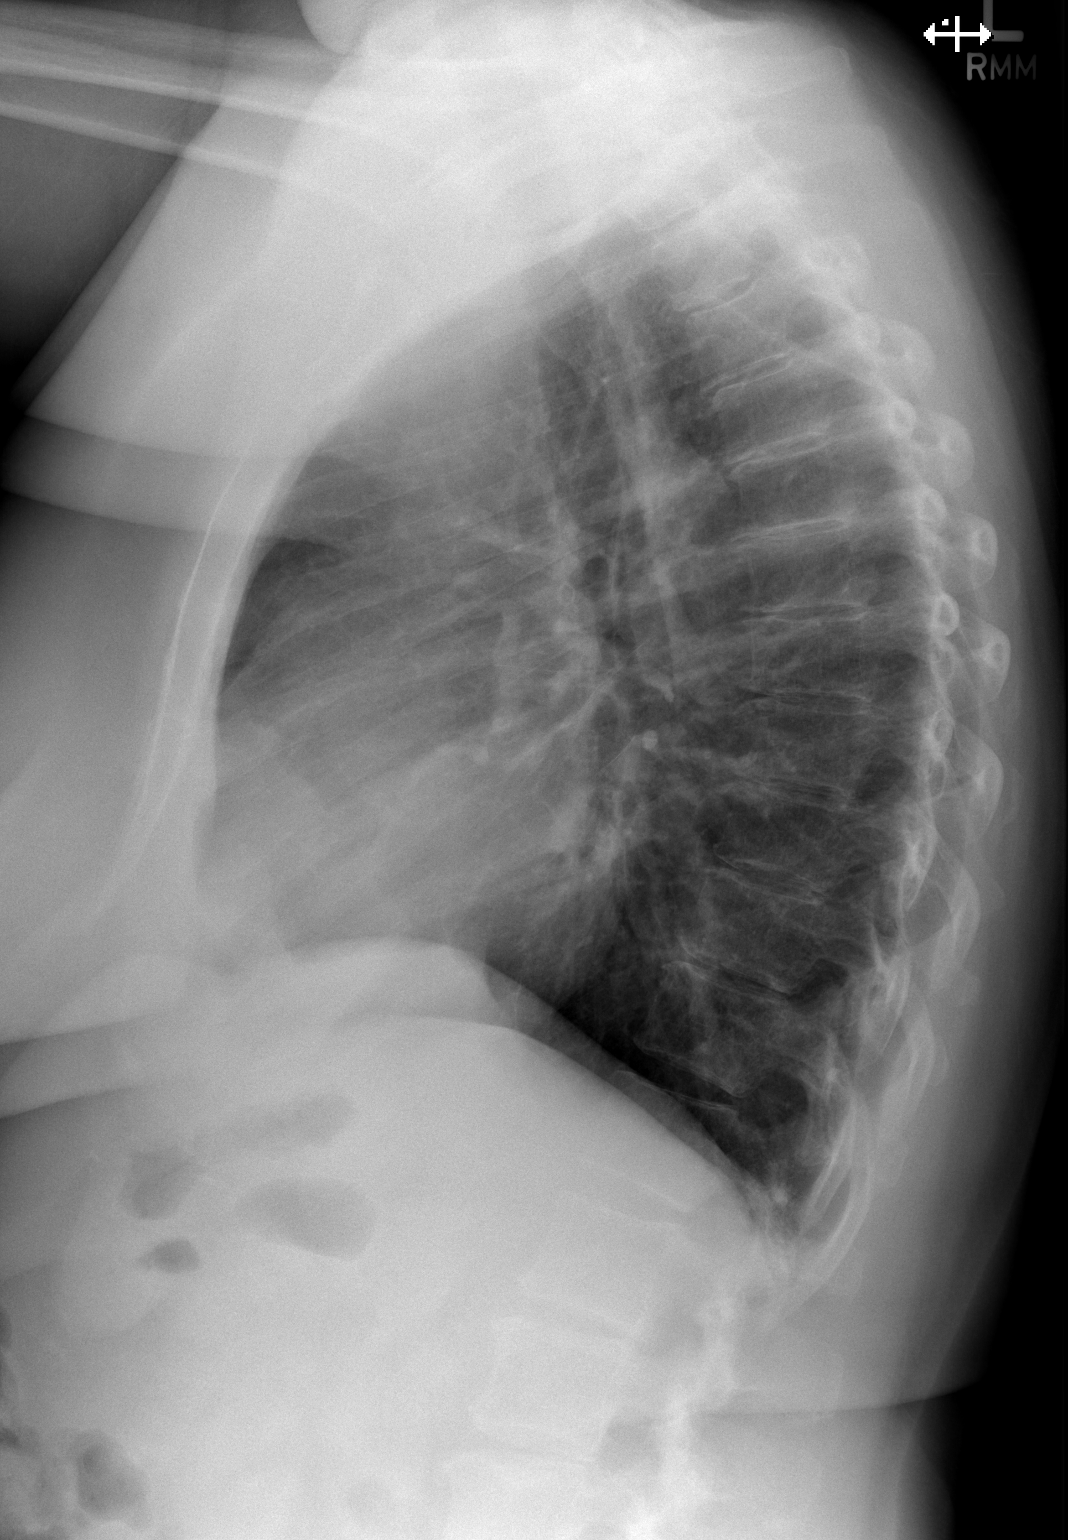

[2 of 2 positions shown; findings below may reference images not displayed]

FINDINGS: Cardiomediastinal silhouette unchanged in size and contour. No
evidence of central vascular congestion. No interlobular septal
thickening.

No pneumothorax or pleural effusion. Coarsened interstitial
markings, with no confluent airspace disease.

No acute displaced fracture. Degenerative changes of the spine.
IMPRESSION: No active cardiopulmonary disease.

## 2022-11-11 ENCOUNTER — Ambulatory Visit
Admission: RE | Admit: 2022-11-11 | Discharge: 2022-11-11 | Disposition: A | Discharge status: Home / Self Care | Payer: BC Managed Care – PPO | Source: Ambulatory Visit | Attending: Obstetrics and Gynecology | Admitting: Obstetrics and Gynecology

## 2022-11-11 DIAGNOSIS — Z0000 Encounter for general adult medical examination without abnormal findings: Secondary | ICD-10-CM

## 2022-11-15 ENCOUNTER — Encounter: Payer: Self-pay | Admitting: Internal Medicine

## 2022-12-14 ENCOUNTER — Encounter: Payer: BC Managed Care – PPO | Admitting: Dermatology

## 2022-12-29 ENCOUNTER — Encounter: Payer: Self-pay | Admitting: Dermatology

## 2022-12-29 ENCOUNTER — Ambulatory Visit: Payer: BC Managed Care – PPO | Admitting: Dermatology

## 2022-12-29 DIAGNOSIS — W908XXA Exposure to other nonionizing radiation, initial encounter: Secondary | ICD-10-CM | POA: Diagnosis not present

## 2022-12-29 DIAGNOSIS — D2362 Other benign neoplasm of skin of left upper limb, including shoulder: Secondary | ICD-10-CM

## 2022-12-29 DIAGNOSIS — L814 Other melanin hyperpigmentation: Secondary | ICD-10-CM | POA: Diagnosis not present

## 2022-12-29 DIAGNOSIS — Z7189 Other specified counseling: Secondary | ICD-10-CM

## 2022-12-29 DIAGNOSIS — L821 Other seborrheic keratosis: Secondary | ICD-10-CM

## 2022-12-29 DIAGNOSIS — Z86018 Personal history of other benign neoplasm: Secondary | ICD-10-CM

## 2022-12-29 DIAGNOSIS — L578 Other skin changes due to chronic exposure to nonionizing radiation: Secondary | ICD-10-CM | POA: Diagnosis not present

## 2022-12-29 DIAGNOSIS — D229 Melanocytic nevi, unspecified: Secondary | ICD-10-CM

## 2022-12-29 DIAGNOSIS — L918 Other hypertrophic disorders of the skin: Secondary | ICD-10-CM

## 2022-12-29 DIAGNOSIS — Z1283 Encounter for screening for malignant neoplasm of skin: Secondary | ICD-10-CM | POA: Diagnosis not present

## 2022-12-29 DIAGNOSIS — L820 Inflamed seborrheic keratosis: Secondary | ICD-10-CM

## 2022-12-29 DIAGNOSIS — D239 Other benign neoplasm of skin, unspecified: Secondary | ICD-10-CM

## 2022-12-29 DIAGNOSIS — D235 Other benign neoplasm of skin of trunk: Secondary | ICD-10-CM

## 2022-12-29 DIAGNOSIS — D1801 Hemangioma of skin and subcutaneous tissue: Secondary | ICD-10-CM

## 2022-12-29 NOTE — Patient Instructions (Signed)

## 2022-12-29 NOTE — Progress Notes (Signed)
Follow-Up Visit   Subjective  [name] is a 67 y.o. female who presents for the following: Skin Cancer Screening and Full Body Skin Exam  The patient presents for Total-Body Skin Exam (TBSE) for skin cancer screening and mole check. The patient has spots, moles and lesions to be evaluated, some may be new or changing and the patient may have concern these could be cancer.  Hx dysplastic nevi.  The following portions of the chart were reviewed this encounter and updated as appropriate: medications, allergies, medical history  Review of Systems:  No other skin or systemic complaints except as noted in HPI or Assessment and Plan.  Objective  Well appearing patient in no apparent distress; mood and affect are within normal limits.  A full examination was performed including scalp, head, eyes, ears, nose, lips, neck, chest, axillae, abdomen, back, buttocks, bilateral upper extremities, bilateral lower extremities, hands, feet, fingers, toes, fingernails, and toenails. All findings within normal limits unless otherwise noted below.   Relevant physical exam findings are noted in the Assessment and Plan.  R Knee x 1, L knee x 1, L pretibia x 1 (3) Erythematous stuck-on, waxy papule or plaque    Assessment & Plan   SKIN CANCER SCREENING PERFORMED TODAY.  ACTINIC DAMAGE - Chronic condition, secondary to cumulative UV/sun exposure - diffuse scaly erythematous macules with underlying dyspigmentation - Recommend daily broad spectrum sunscreen SPF 30+ to sun-exposed areas, reapply every 2 hours as needed.  - Staying in the shade or wearing long sleeves, sun glasses (UVA+UVB protection) and wide brim hats (4-inch brim around the entire circumference of the hat) are also recommended for sun protection.  - Call for new or changing lesions.  LENTIGINES, SEBORRHEIC KERATOSES, HEMANGIOMAS - Benign normal skin lesions - Benign-appearing - Call for any changes  Lentigos and actinic  changes Counseling for BBL / IPL / Laser and Coordination of Care Discussed the treatment option of Broad Band Light (BBL) /Intense Pulsed Light (IPL)/ Laser for skin discoloration, including brown spots and redness.  Typically we recommend at least 1-3 treatment sessions about 5-8 weeks apart for best results.  Cannot have tanned skin when BBL performed, and regular use of sunscreen/photoprotection is advised after the procedure to help maintain results. The patient's condition may also require "maintenance treatments" in the future.  The fee for BBL / laser treatments is $350 per treatment session for the whole face.  A fee can be quoted for other parts of the body.  Insurance typically does not pay for BBL/laser treatments and therefore the fee is an out-of-pocket cost. Recommend prophylactic valtrex treatment. Once scheduled for procedure, will send Rx in prior to patient's appointment.    MELANOCYTIC NEVI - Tan-brown and/or pink-flesh-colored symmetric macules and papules - Benign appearing on exam today - Observation - Call clinic for new or changing moles - Recommend daily use of broad spectrum spf 30+ sunscreen to sun-exposed areas.   DERMATOFIBROMA Exam: Firm pink/brown papulenodule with dimple sign at right upper  back paraspinal, left deltoid. Treatment Plan: A dermatofibroma is a benign growth possibly related to trauma, such as an insect bite, cut from shaving, or inflamed acne-type bump.  Treatment options to remove include shave or excision with resulting scar and risk of recurrence.  Since benign-appearing and not bothersome, will observe for now.   Acrochordons (Skin Tags) - Fleshy, skin-colored pedunculated papules - Benign appearing.  - Observe. - If desired, they can be removed with an in office procedure that  is not covered by insurance. - Please call the clinic if you notice any new or changing lesions.  Inflamed seborrheic keratosis (3) R Knee x 1, L knee x 1, L  pretibia x 1  Symptomatic, irritating, patient would like treated.  Benign-appearing.  Call clinic for new or changing lesions.    Destruction of lesion - R Knee x 1, L knee x 1, L pretibia x 1 (3)  Destruction method: cryotherapy   Informed consent: discussed and consent obtained   Lesion destroyed using liquid nitrogen: Yes   Cryotherapy cycles:  2 Outcome: patient tolerated procedure well with no complications   Post-procedure details: wound care instructions given     Return in about 1 year (around 12/29/2023) for TBSE, with Dr. Kirtland Bouchard, Hx Dysplastic Nevi.  [name], RMA, am acting as scribe for Armida Sans, MD .   Documentation: I have reviewed the above documentation for accuracy and completeness, and I agree with the above.  Armida Sans, MD

## 2023-08-07 ENCOUNTER — Other Ambulatory Visit: Payer: Self-pay | Admitting: Physician Assistant

## 2023-08-07 DIAGNOSIS — G43009 Migraine without aura, not intractable, without status migrainosus: Secondary | ICD-10-CM

## 2023-08-10 ENCOUNTER — Encounter: Payer: Self-pay | Admitting: Dermatology

## 2023-08-10 ENCOUNTER — Ambulatory Visit: Payer: Self-pay | Admitting: Dermatology

## 2023-08-10 DIAGNOSIS — L720 Epidermal cyst: Secondary | ICD-10-CM

## 2023-08-10 DIAGNOSIS — L821 Other seborrheic keratosis: Secondary | ICD-10-CM

## 2023-08-10 NOTE — Patient Instructions (Signed)

## 2023-08-10 NOTE — Progress Notes (Signed)
   Follow-Up Visit   Subjective  [name] is a 68 y.o. female who presents for the following: place on L bottom lip, appeared x couple months ago, irritated.    The following portions of the chart were reviewed this encounter and updated as appropriate: medications, allergies, medical history  Review of Systems:  No other skin or systemic complaints except as noted in HPI or Assessment and Plan.  Objective  Well appearing patient in no apparent distress; mood and affect are within normal limits.  A focused examination was performed of the following areas: Face  Relevant exam findings are noted in the Assessment and Plan.    Assessment & Plan   Milia - tiny firm white papules at L lower vermilion border - type of cyst - benign - sometimes these will clear with nightly OTC adapalene/Differin 0.1% gel or retinol. - may be extracted if symptomatic - observe - Patient advised if it grows or bleeds call us  back sooner and consider biopsy.   SEBORRHEIC KERATOSIS - Stuck-on, waxy, tan-brown papules R cheek - Benign-appearing - Discussed benign etiology and prognosis. - Observe - Call for any changes   MILIA   SEBORRHEIC KERATOSES    Return for As scheduled, w/ Dr. Hester.  I, Jacquelynn V. Wilfred, CMA, am acting as scribe for Boneta Sharps, MD .   Documentation: I have reviewed the above documentation for accuracy and completeness, and I agree with the above.  Boneta Sharps, MD

## 2023-08-31 ENCOUNTER — Other Ambulatory Visit: Payer: Self-pay | Admitting: Physician Assistant

## 2023-09-05 LAB — HM PAP SMEAR
HM Pap smear: ABNORMAL
HPV, high-risk: NEGATIVE

## 2023-09-07 ENCOUNTER — Other Ambulatory Visit: Payer: Self-pay | Admitting: Obstetrics and Gynecology

## 2023-09-07 DIAGNOSIS — Z1231 Encounter for screening mammogram for malignant neoplasm of breast: Secondary | ICD-10-CM

## 2023-10-12 ENCOUNTER — Telehealth: Payer: Self-pay

## 2023-10-12 NOTE — Telephone Encounter (Signed)
 Copied from CRM [id number]. Topic: General - Other >> Oct 11, 2023  4:51 PM Jasmin G wrote: Reason for CRM: Pt called to check if she has a had a shingles vaccine or she if she could check herself through MyChart, call pt back at [phone].

## 2023-10-13 ENCOUNTER — Encounter: Payer: Self-pay | Admitting: Physician Assistant

## 2023-10-13 ENCOUNTER — Ambulatory Visit (INDEPENDENT_AMBULATORY_CARE_PROVIDER_SITE_OTHER): Admitting: Physician Assistant

## 2023-10-13 ENCOUNTER — Encounter: Admitting: Physician Assistant

## 2023-10-13 VITALS — BP 136/78 | HR 66 | Wt 181.0 lb

## 2023-10-13 DIAGNOSIS — G43009 Migraine without aura, not intractable, without status migrainosus: Secondary | ICD-10-CM

## 2023-10-13 MED ORDER — SERTRALINE HCL 100 MG PO TABS
100.0000 mg | ORAL_TABLET | Freq: Every day | ORAL | 3 refills | Status: AC
Start: 2023-10-13 — End: ?

## 2023-10-13 MED ORDER — SUMATRIPTAN SUCCINATE 100 MG PO TABS: ORAL_TABLET | ORAL | 11 refills | Status: AC

## 2023-10-13 MED ORDER — PROPRANOLOL HCL 20 MG PO TABS: 20.0000 mg | ORAL_TABLET | Freq: Two times a day (BID) | ORAL | 2 refills | Status: DC

## 2023-10-13 MED ORDER — ONDANSETRON 4 MG PO TBDP: 4.0000 mg | ORAL_TABLET | Freq: Three times a day (TID) | ORAL | 1 refills | Status: AC | PRN

## 2023-10-13 NOTE — Progress Notes (Signed)
 Pt states she has been doing much better.   History:  [name] is a 68 y.o. G2P0002 who presents to clinic today for headache followup.  She has been using propanolol 20mg  in the morning and 1/2 tab in the evening.  This has worked well.  THe 2 full tabs a day made her not feel so great.   Her medical history is unchanged with no cardiac issues.   She is loving retirement and definitely feels less stressed and more free.   She has been sometimes breaking the sertraline  in half and that has not caused problems. She notes she is sweating lots and wonders if it's related to that medication.     Number of days in the last 4 weeks with:  Severe headache: 0 Moderate headache: 2 Mild headache: 8  No headache: 18   Past Medical History:  Diagnosis Date   Allergy    AR (allergic rhinitis)    Dysplastic nevus 06/14/2007   Left lateral back braline, slight   Dysplastic nevus 03/18/2009   R mid to low back paraspinal, excision, mod-severe   Hypertension    IBS (irritable bowel syndrome)    Migraines    1-3x/wk   Osteoarthritis    Osteopenia     Social History   Socioeconomic History   Marital status: Married    Spouse name: Not on file   Number of children: 2   Years of education: Not on file   Highest education level: Not on file  Occupational History   Occupation: Treasurer/Secretary    Comment: [employer]   Tobacco Use   Smoking status: Former    Current packs/day: 0.00    Average packs/day: 0.8 packs/day for 12.0 years (9.0 ttl pk-yrs)    Types: Cigarettes    Start date: 01/24/1976    Quit date: 01/24/1988    Years since quitting: 35.7   Smokeless tobacco: Never  Vaping Use   Vaping status: Never Used  Substance and Sexual Activity   Alcohol use: Yes    Alcohol/week: 3.0 standard drinks of alcohol    Types: 1 Cans of beer, 2 Standard drinks or equivalent per week    Comment: Rare   Drug use: No   Sexual activity: Yes    Partners: Male  Other Topics  Concern   Not on file  Social History Narrative   Not on file   Social Drivers of Health   Financial Resource Strain: Not on file  Food Insecurity: Not on file  Transportation Needs: Not on file  Physical Activity: Not on file  Stress: Not on file  Social Connections: Not on file  Intimate Partner Violence: Not on file    Family History  Problem Relation Age of Onset   Macular degeneration Mother    Hypertension Father    Diabetes Father    Hypertension Sister    Cerebral aneurysm Sister    Cancer Maternal Aunt        breast    Diabetes Paternal Grandfather    Cerebral aneurysm Brother    Colon cancer Neg Hx    Esophageal cancer Neg Hx    Stomach cancer Neg Hx    Rectal cancer Neg Hx     Allergies  Allergen Reactions   Codeine     Other Reaction(s): Not available    Current Outpatient Medications on File Prior to Visit  Medication Sig Dispense Refill   estradiol (ESTRACE) 1 MG tablet Take 1 mg by mouth  daily.     progesterone (PROMETRIUM) 200 MG capsule progesterone micronized 200 mg capsule     propranolol  (INDERAL ) 20 MG tablet TAKE 1 TABLET BY MOUTH TWICE A DAY 180 tablet 2   sertraline  (ZOLOFT ) 100 MG tablet Take 1 tablet (100 mg total) by mouth daily. 90 tablet 3   SUMAtriptan  (IMITREX ) 100 MG tablet TAKE 1 TABLET BY MOUTH 1 TIME. MAY REPEAT IN 2 HOURS IF HEADACHE PERSISTS OR RECURS 9 tablet 1   azelastine (ASTELIN) 0.1 % nasal spray Place into both nostrils.     meloxicam  (MOBIC ) 15 MG tablet TAKE 1 TABLET(15 MG) BY MOUTH DAILY (Patient not taking: Reported on 10/13/2023) 30 tablet 3   No current facility-administered medications on file prior to visit.     Review of Systems:  All pertinent positive/negative included in HPI, all other review of systems are negative   Objective:  Physical Exam BP 136/78   Pulse 66   Wt 181 lb (82.1 kg)   BMI 31.56 kg/m  CONSTITUTIONAL: Well-developed, well-nourished female in no acute distress.  EYES: EOM  intact ENT: Normocephalic CARDIOVASCULAR: Regular rate and rhythm with no adventitious sounds.  RESPIRATORY: Normal rate. Clear to auscultation bilaterally.  ENDOCRINE: Normal thyroid.  MUSCULOSKELETAL: Normal ROM SKIN: Warm, dry without erythema  NEUROLOGICAL: Alert, oriented, CN II-XII grossly intact, Appropriate balance PSYCH: Normal behavior, mood   Assessment & Plan:  Assessment: 1. Migraine without aura and without status migrainosus, not intractable      Plan: Continue meds as previous.  Propanolol /sertraline  for prevention.  Sumatriptan  for abortive therapy Follow-up in 12 months or sooner PRN  Face to face time this encounter: 43 minutes  Christine Gretta Darice FORBES, PA-C 10/13/2023 9:43 AM

## 2023-11-16 ENCOUNTER — Ambulatory Visit
Admission: RE | Admit: 2023-11-16 | Discharge: 2023-11-16 | Disposition: A | Discharge status: Home / Self Care | Source: Ambulatory Visit | Attending: Obstetrics and Gynecology | Admitting: Obstetrics and Gynecology

## 2023-11-16 DIAGNOSIS — Z1231 Encounter for screening mammogram for malignant neoplasm of breast: Secondary | ICD-10-CM

## 2023-11-21 ENCOUNTER — Other Ambulatory Visit: Payer: Self-pay | Admitting: Obstetrics and Gynecology

## 2023-11-21 DIAGNOSIS — R928 Other abnormal and inconclusive findings on diagnostic imaging of breast: Secondary | ICD-10-CM

## 2023-11-30 ENCOUNTER — Ambulatory Visit
Admission: RE | Admit: 2023-11-30 | Discharge: 2023-11-30 | Disposition: A | Discharge status: Home / Self Care | Source: Ambulatory Visit | Attending: Obstetrics and Gynecology | Admitting: Obstetrics and Gynecology

## 2023-11-30 DIAGNOSIS — R928 Other abnormal and inconclusive findings on diagnostic imaging of breast: Secondary | ICD-10-CM

## 2023-12-01 ENCOUNTER — Ambulatory Visit

## 2023-12-01 VITALS — BP 110/70 | HR 90 | Temp 99.1°F | Ht 63.5 in | Wt 164.0 lb

## 2023-12-01 DIAGNOSIS — Z131 Encounter for screening for diabetes mellitus: Secondary | ICD-10-CM | POA: Diagnosis not present

## 2023-12-01 DIAGNOSIS — E663 Overweight: Secondary | ICD-10-CM | POA: Insufficient documentation

## 2023-12-01 DIAGNOSIS — I10 Essential (primary) hypertension: Secondary | ICD-10-CM | POA: Insufficient documentation

## 2023-12-01 DIAGNOSIS — F419 Anxiety disorder, unspecified: Secondary | ICD-10-CM | POA: Diagnosis not present

## 2023-12-01 DIAGNOSIS — Z113 Encounter for screening for infections with a predominantly sexual mode of transmission: Secondary | ICD-10-CM | POA: Diagnosis not present

## 2023-12-01 DIAGNOSIS — M8580 Other specified disorders of bone density and structure, unspecified site: Secondary | ICD-10-CM | POA: Insufficient documentation

## 2023-12-01 DIAGNOSIS — Z136 Encounter for screening for cardiovascular disorders: Secondary | ICD-10-CM

## 2023-12-01 NOTE — Progress Notes (Signed)
 Subjective:   This visit was conducted in person. The patient gave informed consent to the use of [employer] AI technology to record the contents of the encounter as documented below.   Patient ID: [name], female    DOB: 10-08-1955, 68 y.o.   MRN: [id number]   [name] is a very pleasant 68 y.o. female who presents today as a new patient.  Past medical, surgical and family history: Reviewed and updated in chart.  Allergies: Reviewed and updated in chart.  Medications: Reviewed and updated in chart.  Social history: Reviewed and updated in chart.  Last PCP and reason for leaving: Lost to follow up  Discussed the use of AI scribe software for clinical note transcription with the patient, who gave verbal consent to proceed.  History of Present Illness [name] is a 68 year old female who presents for a routine follow-up.  She has a history of migraines, now mostly without aura and more weather-related. She uses Imitrex  as needed and Zofran  occasionally for nausea. She takes Zoloft  daily for anxiety, which has also helped with menopausal symptoms. Propranolol  was previously used for migraines and blood pressure but was discontinued over a week ago.  She has been on estradiol and progesterone for a long time and is reducing her estrogen dose, now taking 0.5 mg. She reports occasional leg cramps and has a history of uterine polyps removed two years ago, with a normal Pap smear in July.  She has a history of occipital neuralgia on the left side and allergies to dogs, dust, and freeze. She has a family history of osteoporosis in her mother and aunts.  She lives with her husband and her dog, [name], and is retired. She occasionally consumes alcohol and quit smoking in 1989 after 12 years. She is the primary caregiver for her aunt, who lives independently.  She is currently on tirzepatide 5 mg weekly for weight loss, which she has been taking for six weeks, resulting in  a weight loss of two pounds per week. She reports bloating as a side effect but is managing it. She has a history of IBS and experiences 'dump syndrome' post-gallbladder removal.   Review of Systems  All other systems reviewed and are negative.       BP 110/70 (BP Location: Left Arm, Patient Position: Sitting, Cuff Size: Large)   Pulse 90   Temp 99.1 F (37.3 C) (Oral)   Ht 5' 3.5 (1.613 m)   Wt 164 lb (74.4 kg)   SpO2 97%   BMI 28.60 kg/m   Objective:     Physical Exam GENERAL: Alert, cooperative, well developed, no acute distress. HEAD: Normocephalic atraumatic. EYES: Extraocular movements intact BL, pupils round, equal and reactive to light BL, conjunctivae normal BL. EXTREMITIES: No cyanosis or edema. NEUROLOGICAL: Oriented to person, place and time, no gait abnormalities, moves all extremities without gross motor or sensory deficit.        Assessment & Plan:   Assessment & Plan Anxiety disorder New patient, past medical and social history thoroughly reviewed and updated in chart.  Managed with Zoloft , aiding in menopausal symptom management. No low mood or depression reported at this time, no changes to regimen.  - Continue Zoloft  100 mg daily   General Health maintenance Due for physical, will organize in the coming weeks.  - Ordered fasting labs for cholesterol and diabetes one week prior to physical exam. - Scheduled physical exam in four weeks. - Arranged evaluation by  Medicare wellness coach for non-medical concerns.   Essential hypertension Propranolol  reportedly discontinued.  She is normotensive this visit at 110/70. - continue to monitor and will refrain from any medications at this time.  Overweight, currently managed with tirzepatide Obesity managed with tirzepatide 5 mg weekly for six weeks which she is getting privately. Reports weight loss of two pounds per week and improved eating habits.  Discussed potential side effects including  gastrointestinal issues and rare risks such as pancreatitis and vision changes. Experiences bloating but managing well. Would benefit from weight management referral given that discontinuation of GLP-1's is associated with rebound weight gain, will discuss at next visit.  -Patient decides to continue tirzepatide 5 mg weekly. - Educated on potential side effects and when to seek medical attention.    Return in about 4 weeks (around 12/29/2023) for CPE, fasting labs in 1wk, medicare wellness in 1wk.   [name] K Elen Acero, MD  12/01/23    Contains text generated by [employer].

## 2023-12-06 ENCOUNTER — Ambulatory Visit: Payer: Self-pay

## 2024-01-04 ENCOUNTER — Encounter: Payer: Self-pay | Admitting: Dermatology

## 2024-01-04 ENCOUNTER — Ambulatory Visit: Payer: BC Managed Care – PPO | Admitting: Dermatology

## 2024-01-04 DIAGNOSIS — D1801 Hemangioma of skin and subcutaneous tissue: Secondary | ICD-10-CM | POA: Diagnosis not present

## 2024-01-04 DIAGNOSIS — L814 Other melanin hyperpigmentation: Secondary | ICD-10-CM

## 2024-01-04 DIAGNOSIS — L570 Actinic keratosis: Secondary | ICD-10-CM | POA: Diagnosis not present

## 2024-01-04 DIAGNOSIS — L578 Other skin changes due to chronic exposure to nonionizing radiation: Secondary | ICD-10-CM | POA: Diagnosis not present

## 2024-01-04 DIAGNOSIS — W908XXA Exposure to other nonionizing radiation, initial encounter: Secondary | ICD-10-CM

## 2024-01-04 DIAGNOSIS — Z86018 Personal history of other benign neoplasm: Secondary | ICD-10-CM

## 2024-01-04 DIAGNOSIS — Z1283 Encounter for screening for malignant neoplasm of skin: Secondary | ICD-10-CM

## 2024-01-04 DIAGNOSIS — L820 Inflamed seborrheic keratosis: Secondary | ICD-10-CM

## 2024-01-04 DIAGNOSIS — L821 Other seborrheic keratosis: Secondary | ICD-10-CM | POA: Diagnosis not present

## 2024-01-04 DIAGNOSIS — D229 Melanocytic nevi, unspecified: Secondary | ICD-10-CM

## 2024-01-04 NOTE — Progress Notes (Signed)
 Follow-Up Visit   Subjective  [name] is a 68 y.o. female who presents for the following: Skin Cancer Screening and Full Body Skin Exam, hx of Dysplastic nevus  The patient presents for Total-Body Skin Exam (TBSE) for skin cancer screening and mole check. The patient has spots, moles and lesions to be evaluated, some may be new or changing and the patient may have concern these could be cancer.  The following portions of the chart were reviewed this encounter and updated as appropriate: medications, allergies, medical history  Review of Systems:  No other skin or systemic complaints except as noted in HPI or Assessment and Plan.  Objective  Well appearing patient in no apparent distress; mood and affect are within normal limits.  A full examination was performed including scalp, head, eyes, ears, nose, lips, neck, chest, axillae, abdomen, back, buttocks, bilateral upper extremities, bilateral lower extremities, hands, feet, fingers, toes, fingernails, and toenails. All findings within normal limits unless otherwise noted below.   Relevant physical exam findings are noted in the Assessment and Plan.  left lateral lower lip x 1 Erythematous thin papules/macules with gritty scale.  left thigh near groin x 1, back x 2 (3) Stuck-on, waxy, tan-brown papule or plaque --Discussed benign etiology and prognosis.   Assessment & Plan   SKIN CANCER SCREENING PERFORMED TODAY.  ACTINIC DAMAGE - Chronic condition, secondary to cumulative UV/sun exposure - diffuse scaly erythematous macules with underlying dyspigmentation - Recommend daily broad spectrum sunscreen SPF 30+ to sun-exposed areas, reapply every 2 hours as needed.  - Staying in the shade or wearing long sleeves, sun glasses (UVA+UVB protection) and wide brim hats (4-inch brim around the entire circumference of the hat) are also recommended for sun protection.  - Call for new or changing lesions.  LENTIGINES, SEBORRHEIC  KERATOSES, HEMANGIOMAS - Benign normal skin lesions - Benign-appearing - Call for any changes  MELANOCYTIC NEVI - Tan-brown and/or pink-flesh-colored symmetric macules and papules - Benign appearing on exam today - Observation - Call clinic for new or changing moles - Recommend daily use of broad spectrum spf 30+ sunscreen to sun-exposed areas.  AK (ACTINIC KERATOSIS) left lateral lower lip x 1 ACTINIC DAMAGE - chronic, secondary to cumulative UV radiation exposure/sun exposure over time - diffuse scaly erythematous macules with underlying dyspigmentation - Recommend daily broad spectrum sunscreen SPF 30+ to sun-exposed areas, reapply every 2 hours as needed.  - Recommend staying in the shade or wearing long sleeves, sun glasses (UVA+UVB protection) and wide brim hats (4-inch brim around the entire circumference of the hat). - Call for new or changing lesions.   If not gone in 2 months return to the office  - Destruction of lesion - left lateral lower lip x 1 Complexity: simple   Destruction method: cryotherapy   Informed consent: discussed and consent obtained   Timeout:  patient name, date of birth, surgical site, and procedure verified Lesion destroyed using liquid nitrogen: Yes   Region frozen until ice ball extended beyond lesion: Yes   Outcome: patient tolerated procedure well with no complications   Post-procedure details: wound care instructions given    INFLAMED SEBORRHEIC KERATOSIS (3) left thigh near groin x 1, back x 2 (3) - Destruction of lesion - left thigh near groin x 1, back x 2 (3) Complexity: simple   Destruction method: cryotherapy   Informed consent: discussed and consent obtained   Timeout:  patient name, date of birth, surgical site, and procedure verified Lesion destroyed  using liquid nitrogen: Yes   Region frozen until ice ball extended beyond lesion: Yes   Outcome: patient tolerated procedure well with no complications   Post-procedure details: wound  care instructions given     HISTORY OF DYSPLASTIC NEVUS No evidence of recurrence today Recommend regular full body skin exams Recommend daily broad spectrum sunscreen SPF 30+ to sun-exposed areas, reapply every 2 hours as needed.  Call if any new or changing lesions are noted between office visits   Return in about 1 year (around 01/03/2025) for TBSE, hx of Dysplastic nevus .  IFay Kirks, CMA, am acting as scribe for Alm Rhyme, MD .   Documentation: I have reviewed the above documentation for accuracy and completeness, and I agree with the above.  Alm Rhyme, MD

## 2024-01-04 NOTE — Patient Instructions (Addendum)

## 2024-01-16 ENCOUNTER — Other Ambulatory Visit (INDEPENDENT_AMBULATORY_CARE_PROVIDER_SITE_OTHER)

## 2024-01-16 DIAGNOSIS — Z136 Encounter for screening for cardiovascular disorders: Secondary | ICD-10-CM

## 2024-01-16 DIAGNOSIS — Z131 Encounter for screening for diabetes mellitus: Secondary | ICD-10-CM | POA: Diagnosis not present

## 2024-01-16 DIAGNOSIS — Z113 Encounter for screening for infections with a predominantly sexual mode of transmission: Secondary | ICD-10-CM

## 2024-01-16 LAB — HEMOGLOBIN A1C: Hgb A1c MFr Bld: 5.4 % (ref 4.6–6.5)

## 2024-01-16 LAB — LIPID PANEL
Cholesterol: 198 mg/dL (ref 28–200)
HDL: 50.9 mg/dL
LDL Cholesterol: 90 mg/dL (ref 10–99)
NonHDL: 146.81
Total CHOL/HDL Ratio: 4
Triglycerides: 284 mg/dL — ABNORMAL HIGH (ref 10.0–149.0)
VLDL: 56.8 mg/dL — ABNORMAL HIGH (ref 0.0–40.0)

## 2024-01-17 LAB — HEPATITIS C ANTIBODY: Hepatitis C Ab: NONREACTIVE

## 2024-01-22 ENCOUNTER — Ambulatory Visit: Payer: Self-pay

## 2024-01-23 ENCOUNTER — Encounter

## 2024-01-23 ENCOUNTER — Ambulatory Visit

## 2024-01-23 VITALS — BP 114/82 | HR 66 | Temp 97.9°F | Ht 63.5 in | Wt 155.0 lb

## 2024-01-23 DIAGNOSIS — I10 Essential (primary) hypertension: Secondary | ICD-10-CM

## 2024-01-23 DIAGNOSIS — Z780 Asymptomatic menopausal state: Secondary | ICD-10-CM

## 2024-01-23 DIAGNOSIS — Z0000 Encounter for general adult medical examination without abnormal findings: Secondary | ICD-10-CM

## 2024-01-23 DIAGNOSIS — F419 Anxiety disorder, unspecified: Secondary | ICD-10-CM

## 2024-01-23 DIAGNOSIS — Z23 Encounter for immunization: Secondary | ICD-10-CM | POA: Diagnosis not present

## 2024-01-23 NOTE — Progress Notes (Signed)
 "  Assessment & Plan:   This visit was conducted in person. The patient gave informed consent to the use of Abridge AI technology to record the contents of the encounter as documented below.  Assessment & Plan  Physical, annual  I have personally reviewed and have noted: 1. The patient's medical and social history 2. Their use of alcohol, tobacco or illicit drugs 3. Their current medications and supplements 4. The patient's functional ability including ADL's, fall risks, home safety risks and hearing or visual impairment. 5. Diet and physical activities 6. Evidence for depression or mood disorder   Colonoscopy: In 2023, precancerous polyps, 68yr repeat  Mammogram: In 2025, largely WNL, benign cysts, routine screening recommended DEXA: Ordered Pap: 08/24/23 pap showing ascus hpv neg, follows with gyn. Pt opts to continue paps due to numerous prior abnormal paps and cervical polyp Labs: Hep C, Lipid and A1c UTD Immunizations: Prevnar today Diet and Exercise: [employer] 3-5 times/wk, will start back up in Jan Sleep and mood: No concerns Dental and vision:UTD ADLs and IADLs: Capable Cognitive: Intact to orientation, naming, recall and repetition Fall risk and home safety: No concerns Health counselling given: Healthy diet given HLD  Advanced Directives Patient does have advanced directives including living will and healthcare power of attorney. She does not have a copy in the electronic medical record.   HTN Normotensive this visit with similar readings at home.  Not currently on any medications, not really indicated at this time.  Will continue to monitor  Anxiety Symptoms well-controlled on Zoloft , no changes - Continue Zoloft  100 mg daily  Follow-up: Return in about 1 year (around 01/22/2025) for CPE with fasting labs 1wk prior.        Subjective:   Patient ID: [name], female    DOB: November 17, 1955  Age: 68 y.o. MRN: [id number]  Patient Care Team: Bennett Reuben POUR, MD as PCP - General (Family Medicine)   CC:  Chief Complaint  Patient presents with   Annual Exam    Sees GYN, Dermatology.     [name] is a 68 y.o. female here for annual physical and f/u on chronic conditions, see assessment and plan for further details  Discussed the use of AI scribe software for clinical note transcription with the patient, who gave verbal consent to proceed.  History of Present Illness No acute complaints, traveling to [place]  soon so she would like to get up-to-date on all her vaccines.  She says she has been making good diet changes based on the high cholesterol, does not eat much processed foods, admits that she was going to [employer] before, but because of increase in flu and COVID, she is avoiding the gym for now, with plans to start doing again once she is back from her [place]  trip.    Depression Screening;    01/23/2024   12:04 PM 12/01/2023    3:24 PM 12/01/2023    3:17 PM 07/13/2017    3:08 PM  PHQ 2/9 Scores  PHQ - 2 Score 0 0 0 0     Anxiety Screening:     No data to display           ROS: Negative unless specifically indicated above in HPI.       Objective:     BP 114/82 (BP Location: Left Arm, Patient Position: Sitting, Cuff Size: Normal)   Pulse 66   Temp 97.9 F (36.6 C) (Oral)   Ht 5' 3.5 (  1.613 m)   Wt 155 lb (70.3 kg)   SpO2 99%   BMI 27.03 kg/m    Physical Exam  Physical Exam Vitals and nursing note reviewed.  Constitutional:      Appearance: Normal appearance.  HENT:     Head: Normocephalic and atraumatic.     Right Ear: Tympanic membrane, ear canal and external ear normal.     Left Ear: Tympanic membrane, ear canal and external ear normal.     Nose: No congestion or rhinorrhea.     Mouth/Throat:     Mouth: Mucous membranes are moist.     Pharynx: No oropharyngeal exudate or posterior oropharyngeal erythema.  Eyes:     Extraocular Movements: Extraocular movements intact.      Conjunctiva/sclera: Conjunctivae normal.     Pupils: Pupils are equal, round, and reactive to light.  Cardiovascular:     Rate and Rhythm: Normal rate and regular rhythm.     Heart sounds: No murmur heard. Pulmonary:     Effort: Pulmonary effort is normal.     Breath sounds: Normal breath sounds.  Abdominal:     General: Abdomen is flat. Bowel sounds are normal.     Palpations: Abdomen is soft.  Musculoskeletal:     Right lower leg: No edema.     Left lower leg: No edema.  Skin:    General: Skin is warm.  Neurological:     Mental Status: She is alert. Mental status is at baseline.  Psychiatric:        Mood and Affect: Mood normal.        Behavior: Behavior normal.          Kenyette Gundy K Larraine Argo, MD  01/23/2024    Contains text generated by Abridge AI Software.    "

## 2024-01-23 NOTE — Patient Instructions (Signed)
 Thank you for visiting [facility] today! Here's what we talked about: - Bring electronic version of living will and power of attorney - You will get a call about bone scan

## 2024-01-23 NOTE — Progress Notes (Signed)
 Sees GYN and Dermatologist.  Goes to dentist regularly.  Discussed Prevnar 20.

## 2024-02-13 ENCOUNTER — Ambulatory Visit

## 2024-02-13 VITALS — Ht 63.5 in | Wt 145.0 lb

## 2024-02-13 DIAGNOSIS — Z0000 Encounter for general adult medical examination without abnormal findings: Secondary | ICD-10-CM | POA: Diagnosis not present

## 2024-02-13 NOTE — Progress Notes (Addendum)
 "  Chief Complaint  Patient presents with   Medicare Wellness     Subjective:   [name] is a 69 y.o. female who presents for a Medicare Annual Wellness Visit.  Visit info / Clinical Intake: Medicare Wellness Visit Type:: Initial Annual Wellness Visit Persons participating in visit and providing information:: patient Medicare Wellness Visit Mode:: Telephone If telephone:: video declined Since this visit was completed virtually, some vitals may be partially provided or unavailable. Missing vitals are due to the limitations of the virtual format.: Unable to obtain vitals - no equipment If Telephone or Video please confirm:: I connected with patient using audio/video enable telemedicine. I verified patient identity with two identifiers, discussed telehealth limitations, and patient agreed to proceed. Patient Location:: home Provider Location:: office Interpreter Needed?: No Pre-visit prep was completed: yes AWV questionnaire completed by patient prior to visit?: no Living arrangements:: lives with spouse/significant other Patient's Overall Health Status Rating: excellent Typical amount of pain: none Does pain affect daily life?: no Are you currently prescribed opioids?: no  Dietary Habits and Nutritional Risks How many meals a day?: 3 Eats fruit and vegetables daily?: yes Most meals are obtained by: preparing own meals; eating out Diabetic:: no  Functional Status Activities of Daily Living (to include ambulation/medication): Independent Ambulation: Independent Medication Administration: Independent Home Management (perform basic housework or laundry): Independent Manage your own finances?: yes Primary transportation is: driving Concerns about vision?: no *vision screening is required for WTM* Concerns about hearing?: no  Fall Screening Falls in the past year?: 0 Number of falls in past year: 0 Was there an injury with Fall?: 0 Fall Risk Category Calculator:  0 Patient Fall Risk Level: Low Fall Risk  Fall Risk Patient at Risk for Falls Due to: No Fall Risks Fall risk Follow up: Falls evaluation completed; Education provided; Falls prevention discussed  Home and Transportation Safety: All rugs have non-skid backing?: (!) no All stairs or steps have railings?: yes Grab bars in the bathtub or shower?: yes Have non-skid surface in bathtub or shower?: yes Good home lighting?: yes Regular seat belt use?: yes Hospital stays in the last year:: no  Cognitive Assessment Difficulty concentrating, remembering, or making decisions? : no Will 6CIT or Mini Cog be Completed: yes What year is it?: 0 points What month is it?: 0 points Give patient an address phrase to remember (5 components): [address] [place]  About what time is it?: 0 points Count backwards from 20 to 1: 0 points Say the months of the year in reverse: 0 points Repeat the address phrase from earlier: 0 points 6 CIT Score: 0 points  Advance Directives (For Healthcare) Does Patient Have a Medical Advance Directive?: Yes Does patient want to make changes to medical advance directive?: No - Patient declined Type of Advance Directive: Healthcare Power of [place]; Living will Copy of Healthcare Power of Attorney in Chart?: No - copy requested Copy of Living Will in Chart?: No - copy requested  Reviewed/Updated  Reviewed/Updated: Reviewed All (Medical, Surgical, Family, Medications, Allergies, Care Teams, Patient Goals)    Allergies (verified) Codeine   Current Medications (verified) Outpatient Encounter Medications as of 02/13/2024  Medication Sig   estradiol (ESTRACE) 1 MG tablet Take 0.5 mg by mouth daily.   ondansetron  (ZOFRAN -ODT) 4 MG disintegrating tablet Take 1 tablet (4 mg total) by mouth every 8 (eight) hours as needed for nausea or vomiting.   progesterone (PROMETRIUM) 200 MG capsule progesterone micronized 200 mg capsule   sertraline  (ZOLOFT )  100 MG tablet  Take 1 tablet (100 mg total) by mouth daily.   SUMAtriptan  (IMITREX ) 100 MG tablet TAKE 1 TABLET BY MOUTH 1 TIME. MAY REPEAT IN 2 HOURS IF HEADACHE PERSISTS OR RECURS   tirzepatide (MOUNJARO) 5 MG/0.5ML Pen Inject 5 mg into the skin once a week.   No facility-administered encounter medications on file as of 02/13/2024.    History: Past Medical History:  Diagnosis Date   Allergy    AR (allergic rhinitis)    Dysplastic nevus 06/14/2007   Left lateral back braline, slight   Dysplastic nevus 03/18/2009   R mid to low back paraspinal, excision, mod-severe   Hypertension    IBS (irritable bowel syndrome)    Migraines    1-3x/wk   Osteoarthritis    Osteopenia    Past Surgical History:  Procedure Laterality Date   BREAST BIOPSY Left    BREAST CYST ASPIRATION Right 09/07/2017   BROW LIFT Bilateral 08/11/2015   Procedure: BLEPHAROPLASTY BILATERAL UPPER EYELIDS;  Surgeon: Greig CHRISTELLA Gay, MD;  Location: [facility];  Service: Ophthalmology;  Laterality: Bilateral;  per cynde please leave patient first   CARPAL TUNNEL RELEASE  03/2010   right. Left done 2010. Dr Murrell and left side 2012   CHOLECYSTECTOMY  04/1983   COLONOSCOPY     DILATION AND CURETTAGE OF UTERUS  10/2003   tonsillectomy  12/1974   TUBAL LIGATION  1990   VAGINAL DELIVERY     x 2   Family History  Problem Relation Age of Onset   Macular degeneration Mother    Hypertension Father    Diabetes Father    Hypertension Sister    Cerebral aneurysm Sister    Cerebral aneurysm Brother    Prostate cancer Brother    Diabetes Paternal Grandfather    Cancer Maternal Aunt        breast    Colon cancer Neg Hx    Esophageal cancer Neg Hx    Stomach cancer Neg Hx    Rectal cancer Neg Hx    Breast cancer Neg Hx    Social History   Occupational History   Occupation: Nurse, Adult    Comment: Sports Coach    Occupation: retired  Tobacco Use   Smoking status: Former    Current packs/day: 0.00    Average  packs/day: 0.8 packs/day for 12.0 years (9.0 ttl pk-yrs)    Types: Cigarettes    Start date: 01/24/1976    Quit date: 01/24/1988    Years since quitting: 36.0    Passive exposure: Never   Smokeless tobacco: Never  Vaping Use   Vaping status: Never Used  Substance and Sexual Activity   Alcohol use: Not Currently    Alcohol/week: 3.0 standard drinks of alcohol    Types: 3 Glasses of wine per week    Comment: Occasional   Drug use: No   Sexual activity: Yes    Partners: Male   Tobacco Counseling Counseling given: Not Answered  SDOH Screenings   Food Insecurity: No Food Insecurity (02/13/2024)  Housing: Low Risk (02/13/2024)  Transportation Needs: No Transportation Needs (02/13/2024)  Utilities: Not At Risk (02/13/2024)  Alcohol Screen: Low Risk (12/01/2023)  Depression (PHQ2-9): Low Risk (02/13/2024)  Financial Resource Strain: Low Risk (12/01/2023)  Physical Activity: Sufficiently Active (02/13/2024)  Social Connections: Socially Integrated (02/13/2024)  Stress: Stress Concern Present (02/13/2024)  Tobacco Use: Medium Risk (02/13/2024)  Health Literacy: Adequate Health Literacy (02/13/2024)   See flowsheets for full screening details  Depression Screen PHQ 2 & 9 Depression Scale- Over the past 2 weeks, how often have you been bothered by any of the following problems? Little interest or pleasure in doing things: 0 Feeling down, depressed, or hopeless (PHQ Adolescent also includes...irritable): 0 PHQ-2 Total Score: 0     Goals Addressed             This Visit's Progress    Weight (lb) < 130 lb (59 kg)   145 lb (65.8 kg)    Stay active             Objective:    Today's Vitals   02/13/24 0846  Weight: 145 lb (65.8 kg)  Height: 5' 3.5 (1.613 m)   Body mass index is 25.28 kg/m.  Hearing/Vision screen No results found. Immunizations and Health Maintenance Health Maintenance  Topic Date Due   Bone Density Scan  01/09/2023   COVID-19 Vaccine (3 - Pfizer risk  series) 11/29/2024 (Originally 05/17/2019)   Medicare Annual Wellness (AWV)  02/12/2025   Mammogram  11/15/2025   Colonoscopy  03/26/2028   DTaP/Tdap/Td (4 - Td or Tdap) 06/02/2032   Pneumococcal Vaccine: 50+ Years  Completed   Influenza Vaccine  Completed   Hepatitis C Screening  Completed   Zoster Vaccines- Shingrix  Completed   Meningococcal B Vaccine  Aged Out        Assessment/Plan:  This is a routine wellness examination for [name].  Patient Care Team: Bennett Reuben POUR, MD as PCP - General (Family Medicine) Dingeldein, Elspeth, MD (Ophthalmology) Milissa Hamming, MD as Referring Physician (Otolaryngology) Horacio Boas, MD as Consulting Physician (Obstetrics and Gynecology)  I have personally reviewed and noted the following in the patients chart:   Medical and social history Use of alcohol, tobacco or illicit drugs  Current medications and supplements including opioid prescriptions. Functional ability and status Nutritional status Physical activity Advanced directives List of other physicians Hospitalizations, surgeries, and ER visits in previous 12 months Vitals Screenings to include cognitive, depression, and falls Referrals and appointments  No orders of the defined types were placed in this encounter.  In addition, I have reviewed and discussed with patient certain preventive protocols, quality metrics, and best practice recommendations. A written personalized care plan for preventive services as well as general preventive health recommendations were provided to patient.   Christine LITTIE Saris, LPN   8/78/7973   Return in 1 year (on 02/12/2025) for annual wellness.  After Visit Summary: (MyChart) Due to this being a telephonic visit, the after visit summary with patients personalized plan was offered to patient via MyChart   Nurse Notes: No voiced or noted concerns at this time HM Addressed: DEXa already ordered.  Pt says she has not received call for  scheduling. Information placed in AVS.  "

## 2024-02-13 NOTE — Patient Instructions (Addendum)
 Ms. [name],  Thank you for taking the time for your Medicare Wellness Visit. I appreciate your continued commitment to your health goals. Please review the care plan we discussed, and feel free to reach out if I can assist you further.  Please note that Annual Wellness Visits do not include a physical exam. Some assessments may be limited, especially if the visit was conducted virtually. If needed, we may recommend an in-person follow-up with your provider.  Ongoing Care Seeing your primary care provider every 3 to 6 months helps us  monitor your health and provide consistent, personalized care.   Referrals If a referral was made during today's visit and you haven't received any updates within two weeks, please contact the referred provider directly to check on the status.  You have an order for:  []   2D Mammogram  []   3D Mammogram  [x]   Bone Density     Please call for appointment to either of these facilities you prefer::  [facility]  [address] [place] [address] [phone]  [facility] [address] [place], [place] [address] [phone]      Make sure to wear two-piece clothing.  No lotions, powders, or deodorants the day of the appointment. Make sure to bring picture ID and insurance card.  Bring list of medications you are currently taking including any supplements.    Recommended Screenings:  Health Maintenance  Topic Date Due   Medicare Annual Wellness Visit  Never done   Osteoporosis screening with Bone Density Scan  01/09/2023   COVID-19 Vaccine (3 - Pfizer risk series) 11/29/2024*   Breast Cancer Screening  11/15/2025   Colon Cancer Screening  03/26/2028   DTaP/Tdap/Td vaccine (4 - Td or Tdap) 06/02/2032   Pneumococcal Vaccine for age over 25  Completed   Flu Shot  Completed   Hepatitis C Screening  Completed   Zoster (Shingles) Vaccine   Completed   Meningitis B Vaccine  Aged Out  *Topic was postponed. The date shown is not the original due date.       08/11/2015    6:43 AM  Advanced Directives  Does Patient Have a Medical Advance Directive? Yes   Type of Estate Agent of [place];Living will   Copy of Healthcare Power of Attorney in Chart? No - copy requested      Data saved with a previous flowsheet row definition    Vision: Annual vision screenings are recommended for early detection of glaucoma, cataracts, and diabetic retinopathy. These exams can also reveal signs of chronic conditions such as diabetes and high blood pressure.  Dental: Annual dental screenings help detect early signs of oral cancer, gum disease, and other conditions linked to overall health, including heart disease and diabetes.  Please see the attached documents for additional preventive care recommendations.

## 2025-01-02 ENCOUNTER — Ambulatory Visit: Admitting: Dermatology

## 2025-01-16 ENCOUNTER — Other Ambulatory Visit

## 2025-01-23 ENCOUNTER — Encounter
